# Patient Record
Sex: Male | Born: 1965 | Hispanic: No | Marital: Married | Smoking: Never smoker
Health system: Southern US, Community
[De-identification: ages and names within clinical notes are randomized; demographics above are authoritative.]

## PROBLEM LIST (undated history)

## (undated) DIAGNOSIS — N529 Male erectile dysfunction, unspecified: Secondary | ICD-10-CM

## (undated) DIAGNOSIS — G4700 Insomnia, unspecified: Secondary | ICD-10-CM

## (undated) DIAGNOSIS — R1031 Right lower quadrant pain: Secondary | ICD-10-CM

## (undated) DIAGNOSIS — K5730 Diverticulosis of large intestine without perforation or abscess without bleeding: Secondary | ICD-10-CM

## (undated) DIAGNOSIS — R5381 Other malaise: Secondary | ICD-10-CM

## (undated) DIAGNOSIS — F528 Other sexual dysfunction not due to a substance or known physiological condition: Secondary | ICD-10-CM

## (undated) DIAGNOSIS — E739 Lactose intolerance, unspecified: Secondary | ICD-10-CM

## (undated) DIAGNOSIS — F988 Other specified behavioral and emotional disorders with onset usually occurring in childhood and adolescence: Secondary | ICD-10-CM

## (undated) DIAGNOSIS — R21 Rash and other nonspecific skin eruption: Secondary | ICD-10-CM

## (undated) DIAGNOSIS — F329 Major depressive disorder, single episode, unspecified: Secondary | ICD-10-CM

## (undated) DIAGNOSIS — K589 Irritable bowel syndrome without diarrhea: Secondary | ICD-10-CM

## (undated) DIAGNOSIS — R070 Pain in throat: Secondary | ICD-10-CM

## (undated) DIAGNOSIS — E785 Hyperlipidemia, unspecified: Secondary | ICD-10-CM

## (undated) DIAGNOSIS — F411 Generalized anxiety disorder: Secondary | ICD-10-CM

## (undated) DIAGNOSIS — R5383 Other fatigue: Secondary | ICD-10-CM

## (undated) HISTORY — DX: Other malaise: R53.81

## (undated) HISTORY — DX: Diverticulosis of large intestine without perforation or abscess without bleeding: K57.30

## (undated) HISTORY — DX: Male erectile dysfunction, unspecified: N52.9

## (undated) HISTORY — DX: Pain in throat: R07.0

## (undated) HISTORY — DX: Other fatigue: R53.83

## (undated) HISTORY — DX: Lactose intolerance, unspecified: E73.9

## (undated) HISTORY — DX: Insomnia, unspecified: G47.00

## (undated) HISTORY — DX: Generalized anxiety disorder: F41.1

## (undated) HISTORY — DX: Major depressive disorder, single episode, unspecified: F32.9

## (undated) HISTORY — DX: Rash and other nonspecific skin eruption: R21

## (undated) HISTORY — PX: COLONOSCOPY: SHX174

## (undated) HISTORY — DX: Right lower quadrant pain: R10.31

## (undated) HISTORY — DX: Other sexual dysfunction not due to a substance or known physiological condition: F52.8

## (undated) HISTORY — PX: OTHER SURGICAL HISTORY: SHX169

## (undated) HISTORY — DX: Hyperlipidemia, unspecified: E78.5

## (undated) HISTORY — DX: Irritable bowel syndrome without diarrhea: K58.9

## (undated) HISTORY — DX: Other specified behavioral and emotional disorders with onset usually occurring in childhood and adolescence: F98.8

---

## 2006-10-13 ENCOUNTER — Ambulatory Visit: Payer: Self-pay | Admitting: Internal Medicine

## 2006-10-13 LAB — CONVERTED CEMR LAB
ALT: 46 units/L — ABNORMAL HIGH (ref 0–40)
AST: 31 units/L (ref 0–37)
Albumin: 4.3 g/dL (ref 3.5–5.2)
Alkaline Phosphatase: 40 units/L (ref 39–117)
BUN: 9 mg/dL (ref 6–23)
Basophils Absolute: 0.1 10*3/uL (ref 0.0–0.1)
CO2: 30 meq/L (ref 19–32)
Creatinine, Ser: 1 mg/dL (ref 0.4–1.5)
Eosinophils Absolute: 0.1 10*3/uL (ref 0.0–0.6)
GFR calc non Af Amer: 87 mL/min
Glucose, Bld: 98 mg/dL (ref 70–99)
HCT: 43.7 % (ref 39.0–52.0)
Lymphocytes Relative: 32.4 % (ref 12.0–46.0)
MCV: 89.7 fL (ref 78.0–100.0)
Monocytes Absolute: 0.5 10*3/uL (ref 0.2–0.7)
Platelets: 239 10*3/uL (ref 150–400)
Potassium: 4.5 meq/L (ref 3.5–5.1)
Sodium: 141 meq/L (ref 135–145)
Total Bilirubin: 0.5 mg/dL (ref 0.3–1.2)

## 2006-11-09 ENCOUNTER — Ambulatory Visit: Payer: Self-pay | Admitting: Gastroenterology

## 2006-12-24 ENCOUNTER — Ambulatory Visit: Payer: Self-pay | Admitting: Gastroenterology

## 2006-12-24 LAB — HM COLONOSCOPY

## 2007-01-22 ENCOUNTER — Ambulatory Visit: Payer: Self-pay | Admitting: Gastroenterology

## 2007-02-04 ENCOUNTER — Encounter: Admission: RE | Admit: 2007-02-04 | Discharge: 2007-02-04 | Payer: Self-pay | Admitting: Gastroenterology

## 2007-07-08 ENCOUNTER — Ambulatory Visit: Payer: Self-pay | Admitting: Internal Medicine

## 2007-07-08 DIAGNOSIS — F528 Other sexual dysfunction not due to a substance or known physiological condition: Secondary | ICD-10-CM | POA: Insufficient documentation

## 2007-07-08 DIAGNOSIS — E785 Hyperlipidemia, unspecified: Secondary | ICD-10-CM

## 2007-07-08 DIAGNOSIS — E739 Lactose intolerance, unspecified: Secondary | ICD-10-CM

## 2007-07-08 DIAGNOSIS — F988 Other specified behavioral and emotional disorders with onset usually occurring in childhood and adolescence: Secondary | ICD-10-CM | POA: Insufficient documentation

## 2007-07-08 DIAGNOSIS — F411 Generalized anxiety disorder: Secondary | ICD-10-CM

## 2007-07-08 DIAGNOSIS — F3289 Other specified depressive episodes: Secondary | ICD-10-CM

## 2007-07-08 DIAGNOSIS — F32A Depression, unspecified: Secondary | ICD-10-CM | POA: Insufficient documentation

## 2007-07-08 DIAGNOSIS — K5730 Diverticulosis of large intestine without perforation or abscess without bleeding: Secondary | ICD-10-CM | POA: Insufficient documentation

## 2007-07-08 DIAGNOSIS — G4700 Insomnia, unspecified: Secondary | ICD-10-CM | POA: Insufficient documentation

## 2007-07-08 DIAGNOSIS — F329 Major depressive disorder, single episode, unspecified: Secondary | ICD-10-CM

## 2007-07-08 DIAGNOSIS — K589 Irritable bowel syndrome without diarrhea: Secondary | ICD-10-CM

## 2007-07-08 DIAGNOSIS — R7302 Impaired glucose tolerance (oral): Secondary | ICD-10-CM | POA: Insufficient documentation

## 2007-07-08 HISTORY — DX: Major depressive disorder, single episode, unspecified: F32.9

## 2007-07-08 HISTORY — DX: Irritable bowel syndrome, unspecified: K58.9

## 2007-07-08 HISTORY — DX: Other sexual dysfunction not due to a substance or known physiological condition: F52.8

## 2007-07-08 HISTORY — DX: Generalized anxiety disorder: F41.1

## 2007-07-08 HISTORY — DX: Insomnia, unspecified: G47.00

## 2007-07-08 HISTORY — DX: Other specified behavioral and emotional disorders with onset usually occurring in childhood and adolescence: F98.8

## 2007-07-08 HISTORY — DX: Lactose intolerance, unspecified: E73.9

## 2007-07-08 HISTORY — DX: Diverticulosis of large intestine without perforation or abscess without bleeding: K57.30

## 2007-07-08 HISTORY — DX: Hyperlipidemia, unspecified: E78.5

## 2007-07-08 HISTORY — DX: Other specified depressive episodes: F32.89

## 2008-03-09 ENCOUNTER — Ambulatory Visit: Payer: Self-pay | Admitting: Internal Medicine

## 2008-03-09 DIAGNOSIS — R5383 Other fatigue: Secondary | ICD-10-CM

## 2008-03-09 DIAGNOSIS — R5381 Other malaise: Secondary | ICD-10-CM

## 2008-03-09 HISTORY — DX: Other malaise: R53.81

## 2008-03-09 LAB — CONVERTED CEMR LAB
ALT: 42 units/L (ref 0–53)
BUN: 12 mg/dL (ref 6–23)
CO2: 30 meq/L (ref 19–32)
Calcium: 9.8 mg/dL (ref 8.4–10.5)
Chloride: 103 meq/L (ref 96–112)
GFR calc Af Amer: 104 mL/min
Glucose, Bld: 106 mg/dL — ABNORMAL HIGH (ref 70–99)
HCT: 44.3 % (ref 39.0–52.0)
HDL: 42.6 mg/dL (ref >39.0)
Hemoglobin, Urine: NEGATIVE
Hemoglobin: 15.4 g/dL (ref 13.0–17.0)
Lymphocytes Relative: 34 % (ref 12.0–46.0)
MCHC: 34.8 g/dL (ref 30.0–36.0)
Monocytes Absolute: 0.6 10*3/uL (ref 0.1–1.0)
Monocytes Relative: 11.2 % (ref 3.0–12.0)
Neutrophils Relative %: 50.5 % (ref 43.0–77.0)
Nitrite: NEGATIVE
RDW: 11.6 % (ref 11.5–14.6)
Specific Gravity, Urine: <=1.005 (ref 1.000–1.03)
Testosterone: 413.09 ng/dL (ref 350.00–890)
Total CHOL/HDL Ratio: 4.7
Total Protein, Urine: NEGATIVE mg/dL
Triglycerides: 79 mg/dL (ref 0–149)
Urine Glucose: NEGATIVE mg/dL
VLDL: 16 mg/dL (ref 0–40)

## 2008-05-23 ENCOUNTER — Ambulatory Visit: Payer: Self-pay | Admitting: Internal Medicine

## 2008-05-23 DIAGNOSIS — R21 Rash and other nonspecific skin eruption: Secondary | ICD-10-CM

## 2008-05-23 HISTORY — DX: Rash and other nonspecific skin eruption: R21

## 2008-12-11 ENCOUNTER — Telehealth: Payer: Self-pay | Admitting: Internal Medicine

## 2009-07-18 ENCOUNTER — Ambulatory Visit: Payer: Self-pay | Admitting: Internal Medicine

## 2009-07-18 DIAGNOSIS — N529 Male erectile dysfunction, unspecified: Secondary | ICD-10-CM

## 2009-07-18 HISTORY — DX: Male erectile dysfunction, unspecified: N52.9

## 2009-07-18 LAB — CONVERTED CEMR LAB
Alkaline Phosphatase: 39 units/L (ref 39–117)
BUN: 11 mg/dL (ref 6–23)
Basophils Absolute: 0.1 10*3/uL (ref 0.0–0.1)
Bilirubin Urine: NEGATIVE
Bilirubin, Direct: 0.1 mg/dL (ref 0.0–0.3)
CO2: 27 meq/L (ref 19–32)
Creatinine, Ser: 1 mg/dL (ref 0.4–1.5)
Eosinophils Absolute: 0.2 10*3/uL (ref 0.0–0.7)
HCT: 46 % (ref 39.0–52.0)
Hgb A1c MFr Bld: 5.9 % (ref 4.6–6.5)
Leukocytes, UA: NEGATIVE
MCHC: 32.6 g/dL (ref 30.0–36.0)
MCV: 94.6 fL (ref 78.0–100.0)
Neutrophils Relative %: 50.5 % (ref 43.0–77.0)
Nitrite: NEGATIVE
PSA: 1.74 ng/mL (ref 0.10–4.00)
Potassium: 4.2 meq/L (ref 3.5–5.1)
RDW: 11.8 % (ref 11.5–14.6)
Saturation Ratios: 31.3 % (ref 20.0–50.0)
Sodium: 139 meq/L (ref 135–145)
TSH: 1.53 microintl units/mL (ref 0.35–5.50)
Total Protein, Urine: NEGATIVE mg/dL
Triglycerides: 96 mg/dL (ref 0.0–149.0)
Urine Glucose: NEGATIVE mg/dL
Urobilinogen, UA: 0.2 (ref 0.0–1.0)
WBC: 4.6 10*3/uL (ref 4.5–10.5)
pH: 6.5 (ref 5.0–8.0)

## 2009-07-20 LAB — CONVERTED CEMR LAB
Testosterone-% Free: 1.6 % (ref 1.6–2.9)
Tissue Transglutaminase Ab, IgA: 0.2 units (ref <7)

## 2009-07-25 ENCOUNTER — Encounter (INDEPENDENT_AMBULATORY_CARE_PROVIDER_SITE_OTHER): Payer: Self-pay | Admitting: *Deleted

## 2009-08-27 ENCOUNTER — Ambulatory Visit: Payer: Self-pay | Admitting: Gastroenterology

## 2009-08-27 DIAGNOSIS — R1031 Right lower quadrant pain: Secondary | ICD-10-CM | POA: Insufficient documentation

## 2009-08-27 HISTORY — DX: Right lower quadrant pain: R10.31

## 2009-08-31 ENCOUNTER — Telehealth: Payer: Self-pay | Admitting: Gastroenterology

## 2010-01-23 ENCOUNTER — Ambulatory Visit: Payer: Self-pay | Admitting: Internal Medicine

## 2010-01-23 DIAGNOSIS — R070 Pain in throat: Secondary | ICD-10-CM

## 2010-01-23 HISTORY — DX: Pain in throat: R07.0

## 2010-01-23 LAB — CONVERTED CEMR LAB
ALT: 40 units/L (ref 0–53)
Basophils Absolute: 0 10*3/uL (ref 0.0–0.1)
Bilirubin, Direct: 0.1 mg/dL (ref 0.0–0.3)
Chloride: 105 meq/L (ref 96–112)
Creatinine, Ser: 1.3 mg/dL (ref 0.4–1.5)
Eosinophils Absolute: 0.2 10*3/uL (ref 0.0–0.7)
GFR calc non Af Amer: 64.19 mL/min (ref >60)
Glucose, Bld: 106 mg/dL — ABNORMAL HIGH (ref 70–99)
Leukocytes, UA: NEGATIVE
MCV: 92.7 fL (ref 78.0–100.0)
Neutrophils Relative %: 51 % (ref 43.0–77.0)
Nitrite: NEGATIVE
Potassium: 6.1 meq/L (ref 3.5–5.1)
RBC: 5.03 M/uL (ref 4.22–5.81)
Specific Gravity, Urine: 1.015 (ref 1.000–1.030)
Total Bilirubin: 0.8 mg/dL (ref 0.3–1.2)
Total Protein, Urine: NEGATIVE mg/dL
Total Protein: 7.3 g/dL (ref 6.0–8.3)
Urine Glucose: NEGATIVE mg/dL
WBC: 5.4 10*3/uL (ref 4.5–10.5)

## 2010-01-24 ENCOUNTER — Ambulatory Visit: Payer: Self-pay | Admitting: Internal Medicine

## 2010-02-05 ENCOUNTER — Ambulatory Visit: Payer: Self-pay | Admitting: Gastroenterology

## 2010-02-25 ENCOUNTER — Ambulatory Visit: Payer: Self-pay | Admitting: Professional

## 2010-03-04 ENCOUNTER — Ambulatory Visit: Payer: Self-pay | Admitting: Professional

## 2010-05-06 ENCOUNTER — Ambulatory Visit: Admit: 2010-05-06 | Payer: Self-pay | Admitting: Gastroenterology

## 2010-05-29 NOTE — Progress Notes (Signed)
Summary: Wrong pharmacy   Phone Note Call from Patient Call back at Home Phone [phone]   Call For: Dr Arlyce Dice Summary of Call: Prescription sent to wrong pharmacy. Uses [facility] on [address].  Initial call taken by: Leanor Kail [facility],  Aug 31, 2009 12:43 PM  Follow-up for Phone Call        Called pt to inform that Med was sent Follow-up by: Merri Ray CMA Duncan Dull),  Aug 31, 2009 1:24 PM    Prescriptions: HYOMAX-SL 0.125 MG SUBL (HYOSCYAMINE SULFATE) take 2 tabs sublingual q.4 h. p.r.n.  #15 x 3   Entered by:   Merri Ray CMA (AAMA)   Authorized by:   Louis Meckel MD   Signed by:   Merri Ray CMA (AAMA) on 08/31/2009   Method used:   Electronically to        [facility]  [phone]* (retail)       [address]       [place], [place]  [address]       Ph: [phone] or [phone]       Fax: [phone]   RxID:   [id number]

## 2010-05-29 NOTE — Letter (Signed)
Summary: [facility] Consult Scheduled Letter  [facility]  [address] [place], [place] [address]   Phone: [phone]  Fax: [phone]      07/25/2009 MRN: [id number]  [facility] [address] [place], [place]  [address]    Dear Mr. EDMISTER,      We have scheduled an appointment for you.  At the recommendation of Dr.John, we have scheduled you a consult with Dr Arlyce [facility]) on May 2,2011 at 9:45AM.Their phone number is [phone].If this appointment day and time is not convenient for you, please feel free to call the office of the doctor you are being referred to at the number listed above and reschedule the appointment.  Thank you,  [address] [facility] 3rd Floor  [place], [place] [address]  Patient Care Coordinator [facility]

## 2010-05-29 NOTE — Assessment & Plan Note (Signed)
Summary: PER PT D/T-STOMACH ISSUES-FEVER OFF AND ON-SORE THROAT-COUGH-...   Vital Signs:  Patient profile:   45 year old male Height:      67 inches Weight:      163.38 pounds BMI:     25.68 O2 Sat:      97 % on Room air Temp:     98.5 degrees F oral Pulse rate:   55 / minute BP sitting:   108 / 74  (left arm) Cuff size:   regular  Vitals Entered By: Zella Ball Ewing CMA Duncan Dull) (January 23, 2010 10:35 AM)  O2 Flow:  Room air CC: Sore throat, congestion, cough, stomach pain/RE   Primary Care Provider:  Oliver Barre, MD  CC:  Sore throat, congestion, cough, and stomach pain/RE.  History of Present Illness: here after 4 wks ago episode of low grade fever, chills and mild to mod abd pain that lasted 2 wks total, has not been able to excericse;  incidently had URi symtpoms last wk without fever, still with some aching to the throat off adn on mild;  and overall the abd cramping seems overall much worse than usual.  No overt bleeding;  stools different with small stolls and sense of increased constipation. No n/v or radiation of pain. No GU symptoms such a dysuria, or freq  Denies worsening depressive symptoms, suicidal ideation, or panic. Pt denies CP, worsening sob, doe, wheezing, orthopnea, pnd, worsening LE edema, palps, dizziness or syncope   Problems Prior to Update: 1)  Abdominal Pain Right Lower Quadrant  (ICD-789.03) 2)  Preventive Health Care  (ICD-V70.0) 3)  Erectile Dysfunction, Organic  (ICD-607.84) 4)  Rash and Other Nonspecific Skin Eruption  (ICD-782.1) 5)  Fatigue  (ICD-780.79) 6)  Preventive Health Care  (ICD-V70.0) 7)  Hyperlipidemia  (ICD-272.4) 8)  Glucose Intolerance  (ICD-271.3) 9)  Add  (ICD-314.00) 10)  Ibs  (ICD-564.1) 11)  Depression  (ICD-311) 12)  Erectile Dysfunction  (ICD-302.72) 13)  Insomnia-sleep Disorder-unspec  (ICD-780.52) 14)  Anxiety  (ICD-300.00) 15)  Diverticulosis, Colon  (ICD-562.10)  Medications Prior to Update: 1)  Lorazepam 0.5 Mg  Tabs  (Lorazepam) .Marland Kitchen.. 1 By Mouth Once Daily As Needed 2)  Zolpidem Tartrate 10 Mg  Tabs (Zolpidem Tartrate) .Marland Kitchen.. 1 By Mouth At Bedtime As Needed 3)  Viagra 100 Mg  Tabs (Sildenafil Citrate) .Marland Kitchen.. 1 By Mouth Once Daily As Needed 4)  Hyomax-Sl 0.125 Mg Subl (Hyoscyamine Sulfate) .... Take 2 Tabs Sublingual Q.4 H. P.r.n.  Current Medications (verified): 1)  Lorazepam 0.5 Mg  Tabs (Lorazepam) .Marland Kitchen.. 1 By Mouth Once Daily As Needed 2)  Zolpidem Tartrate 10 Mg  Tabs (Zolpidem Tartrate) .Marland Kitchen.. 1 By Mouth At Bedtime As Needed 3)  Viagra 100 Mg  Tabs (Sildenafil Citrate) .Marland Kitchen.. 1 By Mouth Once Daily As Needed 4)  Hyomax-Sl 0.125 Mg Subl (Hyoscyamine Sulfate) .... Take 2 Tabs Sublingual Q.4 H. P.r.n.  Allergies (verified): 1)  Prozac 2)  Wellbutrin 3)  * Imipramine 4)  Celexa 5)  * Pristiq 6)  Effexor  Past History:  Past Surgical History: Last updated: 07/08/2007 Denies surgical history  Social History: Last updated: 08/27/2009 work - [employer] Never Smoked Alcohol use-yes Drug use-no Married Daily Caffeine Use 2 cups per day  Risk Factors: Smoking Status: never (07/08/2007)  Past Medical History: Current Problems:  HYPERLIPIDEMIA (ICD-272.4) GLUCOSE INTOLERANCE (ICD-271.3) ADD (ICD-314.00) IBS (ICD-564.1) DEPRESSION (ICD-311) ERECTILE DYSFUNCTION (ICD-302.72) INSOMNIA-SLEEP DISORDER-UNSPEC (ICD-7 80.52) ANXIETY (ICD-300.00) DIVERTICULOSIS, COLON (ICD-562.10)  Review of Systems  all otherwise negative per pt -    Physical Exam  General:  alert and well-developed.   Head:  normocephalic and atraumatic.   Eyes:  vision grossly intact, pupils equal, and pupils round.   Ears:  R ear normal and no external deformities.   Nose:  no external deformity and no nasal discharge.   Mouth:  no gingival abnormalities and pharynx pink and moist.   Neck:  supple and no masses.   Lungs:  normal respiratory effort and normal breath sounds.   Heart:  normal rate and regular  rhythm.   Abdomen:  soft, non-tender, and normal bowel sounds.  except for mild RLQ tender without guarding or rebound Extremities:  no edema, no erythema  Skin:  color normal and no rashes.   Psych:  not anxious appearing and dysphoric affect.     Impression & Recommendations:  Problem # 1:  ABDOMINAL PAIN RIGHT LOWER QUADRANT (ICD-789.03)  with low grade temp and incr pain recently - ? diverticultiis, now resolved,  ok to check cbc, and f/u with Dr Arlyce Dice;  consider CT for recurrent symtpom  Orders: Gastroenterology Referral (GI) TLB-CBC Platelet - w/Differential (85025-CBCD) TLB-BMP (Basic Metabolic Panel-BMET) (80048-METABOL) TLB-Hepatic/Liver Function Pnl (80076-HEPATIC) TLB-Udip w/ Micro (81001-URINE)  Problem # 2:  THROAT PAIN (ICD-784.1) with recent URI symptoms recntly now resolved except for the discomfort; exam bening, ok to follow for now  Problem # 3:  DEPRESSION (ICD-311)  His updated medication list for this problem includes:    Lorazepam 0.5 Mg Tabs (Lorazepam) .Marland Kitchen... 1 by mouth once daily as needed stable overall by hx and exam, ok to continue meds/tx as is   Complete Medication List: 1)  Lorazepam 0.5 Mg Tabs (Lorazepam) .Marland Kitchen.. 1 by mouth once daily as needed 2)  Zolpidem Tartrate 10 Mg Tabs (Zolpidem tartrate) .Marland Kitchen.. 1 by mouth at bedtime as needed 3)  Viagra 100 Mg Tabs (Sildenafil citrate) .Marland Kitchen.. 1 by mouth once daily as needed 4)  Hyomax-sl 0.125 Mg Subl (Hyoscyamine sulfate) .... Take 2 tabs sublingual q.4 h. p.r.n.  Patient Instructions: 1)  Please go to the Lab in the basement for your blood and/or urine tests today 2)  Please call the number on the [facility] Card for results of your testing  3)  You will be contacted about the referral(s) to: Dr Kaplan/GI 4)  Continue all previous medications as before this visit  5)  Please schedule a follow-up appointment in 6 months with CPX labs

## 2010-05-29 NOTE — Assessment & Plan Note (Signed)
Summary: abd pain--ch.    History of Present Illness Visit Type: Follow-up Visit Primary GI MD: Micheal Heaps MD [facility] Primary Provider: Oliver Barre, MD Requesting Provider: Oliver Barre, MD Chief Complaint: abdominal pain History of Present Illness:   Micheal Grant has returned for reevaluation of abdominall pain.  Since his last visit in 2008 he has continued to have pain in the right periumbilical area which seems to improve with a bowel movement.  With exercise pain worsens.  He underwent colonoscopy in 2008 for this problem which was unrevealing.  Levbid did not help.  Although he moves his bowels up to twice a day he feels there is incomplete incomplete evacuation.  There is no history of melena or hematochezia.  He denies upper GI complaints including nausea, bloating or pyrosis.  His crampy abdominal pain may interfere with his sleep.   GI Review of Systems    Reports abdominal pain.     Location of  Abdominal pain: RUQ.    Denies acid reflux, belching, bloating, chest pain, dysphagia with liquids, dysphagia with solids, heartburn, loss of appetite, nausea, vomiting, vomiting blood, weight loss, and  weight gain.      Reports change in bowel habits, constipation, and  diarrhea.     Denies anal fissure, black tarry stools, diverticulosis, fecal incontinence, heme positive stool, hemorrhoids, irritable bowel syndrome, jaundice, light color stool, liver problems, rectal bleeding, and  rectal pain.    Current Medications (verified): 1)  Lorazepam 0.5 Mg  Tabs (Lorazepam) .Marland Kitchen.. 1 By Mouth Once Daily As Needed 2)  Zolpidem Tartrate 10 Mg  Tabs (Zolpidem Tartrate) .Marland Kitchen.. 1 By Mouth At Bedtime As Needed 3)  Viagra 100 Mg  Tabs (Sildenafil Citrate) .Marland Kitchen.. 1 By Mouth Once Daily As Needed  Allergies (verified): 1)  Prozac 2)  Wellbutrin 3)  * Imipramine 4)  Celexa 5)  * Pristiq 6)  Effexor  Past History:  Past Medical History: Reviewed history from 07/08/2007 and no changes  required. Current Problems:  HYPERLIPIDEMIA (ICD-272.4) GLUCOSE INTOLERANCE (ICD-271.3) ADD (ICD-314.00) IBS (ICD-564.1) DEPRESSION (ICD-311) ERECTILE DYSFUNCTION (ICD-302.72) INSOMNIA-SLEEP DISORDER-UNSPEC (ICD-780.52) ANXIETY (ICD-300.00) DIVERTICULOSIS, COLON (ICD-562.10)  Past Surgical History: Reviewed history from 07/08/2007 and no changes required. Denies surgical history  Family History: Reviewed history from 07/08/2007 and no changes required. sister with depression No FH of Colon Cancer:  Social History: Reviewed history from 07/18/2009 and no changes required. work - [employer] Never Smoked Alcohol use-yes Drug use-no Married Daily Caffeine Use 2 cups per day  Review of Systems       The patient complains of back pain and sleeping problems.  The patient denies allergy/sinus, anemia, anxiety-new, arthritis/joint pain, blood in urine, breast changes/lumps, change in vision, confusion, cough, coughing up blood, depression-new, fainting, fatigue, fever, headaches-new, hearing problems, heart murmur, heart rhythm changes, itching, muscle pains/cramps, night sweats, nosebleeds, shortness of breath, skin rash, sore throat, swelling of feet/legs, swollen lymph glands, thirst - excessive, urination - excessive, urination changes/pain, urine leakage, vision changes, and voice change.         All other systems were reviewed and were negative   Vital Signs:  Patient profile:   45 year old male Height:      67 inches Weight:      171.25 pounds BMI:     26.92 Pulse rate:   68 / minute Pulse rhythm:   regular BP sitting:   118 / 80  (left arm)  Vitals Entered By: Micheal Grant NCMA (Aug 27, 2009 10:07  AM)  Physical Exam  Additional Exam:  On physical exam he is a well-developed well-nourished male  skin: anicteric HEENT: normocephalic; PEERLA; no nasal or pharyngeal abnormalities neck: supple nodes: no cervical lymphadenopathy chest: clear to ausculatation  and percussion heart: no murmurs, gallops, or rubs abd: soft, nontender; BS normoactive; no abdominal masses, tenderness, organomegaly rectal: deferred ext: no cynanosis, clubbing, edema skeletal: no deformities neuro: oriented x 3; no focal abnormalities    Impression & Recommendations:  Problem # 1:  ABDOMINAL PAIN RIGHT LOWER QUADRANT (ICD-789.03) Assessment Unchanged He continues to have pain that is relieved by defecation.  Prior workup including ultrasound was negative.  I suspect this is colonic pain which may be related to mild constipation and bowel spasm.  Recommendations #1 fiber supplementation.  Patient states he is already doing this #2 MiraLax 9-17 g every other day to help keep his bowels moving #3 hyomax p.r.n.  Patient Instructions: 1)  Copy sent to : Micheal Grant 2)  You can pick up your medication from your pharmacy today 3)  Please schedule a 3 week follow up 4)  The medication list was reviewed and reconciled.  All changed / newly prescribed medications were explained.  A complete medication list was provided to the patient / caregiver. Prescriptions: HYOMAX-SL 0.125 MG SUBL (HYOSCYAMINE SULFATE) take 2 tabs sublingual q.4 h. p.r.n.  #15 x 2   Entered and Authorized by:   Micheal Meckel MD   Signed by:   Micheal Meckel MD on 08/27/2009   Method used:   Electronically to        [facility]* (retail)       [address] [place], [place]  [address]       Ph: [phone]       Fax: [phone]   RxID:   [id number]

## 2010-05-29 NOTE — Assessment & Plan Note (Signed)
Summary: f/u appt per pt/#/cd   Vital Signs:  Patient profile:   45 year old male Height:      67 inches Weight:      169.50 pounds BMI:     26.64 O2 Sat:      98 % on Room air Temp:     98.4 degrees F oral Pulse rate:   48 / minute BP sitting:   110 / 72  (left arm) Cuff size:   regular  Vitals Entered ByZella Ball Ewing (July 18, 2009 8:28 AM)  O2 Flow:  Room air  CC: followup/RE   CC:  followup/RE.  History of Present Illness: wt down form 182 to 169 today with better diet and excercise, trying to follow the lower calorie and chol diet;  Pt denies CP, sob, doe, wheezing, orthopnea, pnd, worsening LE edema, palps, dizziness or syncope  Pt denies new neuro symptoms such as headache, facial or extremity weakness  IBS symtpoms seems to be worse  with diet restriction, and more excercise at the diet, and sleep gets worse.  Has some increased ED symptoms with difficulty with softness, and completion.   Denies worsening depressive symptoms, suicidal ideation, panic or increased anxiety per pt  Preventive Screening-Counseling & Management      Drug Use:  no.    Problems Prior to Update: 1)  Erectile Dysfunction, Organic  (ICD-607.84) 2)  Abdominal Pain, Generalized  (ICD-789.07) 3)  Rash and Other Nonspecific Skin Eruption  (ICD-782.1) 4)  Abdominal Pain, Right Upper Quadrant  (ICD-789.01) 5)  Fatigue  (ICD-780.79) 6)  Preventive Health Care  (ICD-V70.0) 7)  Hyperlipidemia  (ICD-272.4) 8)  Glucose Intolerance  (ICD-271.3) 9)  Add  (ICD-314.00) 10)  Ibs  (ICD-564.1) 11)  Depression  (ICD-311) 12)  Erectile Dysfunction  (ICD-302.72) 13)  Insomnia-sleep Disorder-unspec  (ICD-780.52) 14)  Anxiety  (ICD-300.00) 15)  Diverticulosis, Colon  (ICD-562.10)  Medications Prior to Update: 1)  Lorazepam 0.5 Mg  Tabs (Lorazepam) .Marland Kitchen.. 1 By Mouth Once Daily As Needed 2)  Zolpidem Tartrate 10 Mg  Tabs (Zolpidem Tartrate) .Marland Kitchen.. 1 By Mouth At Bedtime As Needed - Needs Return Office Visit For  Further Refills 3)  Viagra 100 Mg  Tabs (Sildenafil Citrate) .Marland Kitchen.. 1 By Mouth Once Daily Prn 4)  Triamcinolone Acetonide 0.5 % Crea (Triamcinolone Acetonide) .... Use Asd Two Times A Day As Needed  Current Medications (verified): 1)  Lorazepam 0.5 Mg  Tabs (Lorazepam) .Marland Kitchen.. 1 By Mouth Once Daily As Needed 2)  Zolpidem Tartrate 10 Mg  Tabs (Zolpidem Tartrate) .Marland Kitchen.. 1 By Mouth At Bedtime As Needed 3)  Viagra 100 Mg  Tabs (Sildenafil Citrate) .Marland Kitchen.. 1 By Mouth Once Daily As Needed  Allergies (verified): 1)  Prozac 2)  Wellbutrin 3)  * Imipramine 4)  Celexa 5)  * Pristiq 6)  Effexor  Past History:  Past Medical History: Last updated: 07/08/2007 Current Problems:  HYPERLIPIDEMIA (ICD-272.4) GLUCOSE INTOLERANCE (ICD-271.3) ADD (ICD-314.00) IBS (ICD-564.1) DEPRESSION (ICD-311) ERECTILE DYSFUNCTION (ICD-302.72) INSOMNIA-SLEEP DISORDER-UNSPEC (ICD-780.52) ANXIETY (ICD-300.00) DIVERTICULOSIS, COLON (ICD-562.10)  Past Surgical History: Last updated: 07/08/2007 Denies surgical history  Family History: Last updated: 07/08/2007 sister with depression  Social History: Last updated: 07/18/2009 work - [employer] Never Smoked Alcohol use-yes Drug use-no Married  Risk Factors: Smoking Status: never (07/08/2007)  Social History: Reviewed history from 07/08/2007 and no changes required. 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         all otherwise negative per pt -     Physical Exam  General:  alert and well-developed.   Head:  normocephalic and atraumatic.   Eyes:  vision grossly intact, pupils equal, and pupils round.   Ears:  R ear normal and no external deformities.   Nose:  no external deformity and no nasal discharge.   Mouth:  no gingival abnormalities and pharynx pink and moist.   Neck:  supple and no masses.   Lungs:  normal respiratory effort and normal breath sounds.   Heart:  normal rate and regular rhythm.   Abdomen:  soft, non-tender, and normal bowel sounds.   Msk:  no joint tenderness and no joint swelling.   Extremities:  no edema, no erythema  Neurologic:  cranial nerves II-XII intact and strength normal in all extremities.   Skin:  color normal and no rashes.   Psych:  good eye contact and not anxious appearing.     Impression & Recommendations:  Problem # 1:  Preventive Health Care (ICD-V70.0) Overall doing well, age appropriate education and counseling updated and referral for appropriate preventive services done unless declined, immunizations up to date or declined, diet counseling done if overweight, urged to quit smoking if smokes , most recent labs reviewed and current ordered if appropriate, ecg reviewed or declined (interpretation per ECG scanned in the EMR if done); information regarding Medicare Prevention requirements given if appropriate  Orders: TLB-BMP (Basic Metabolic Panel-BMET) (80048-METABOL) TLB-CBC Platelet - w/Differential (85025-CBCD) TLB-Hepatic/Liver Function Pnl (80076-HEPATIC) TLB-Lipid Panel (80061-LIPID) TLB-TSH (Thyroid Stimulating Hormone) (84443-TSH) TLB-PSA (Prostate Specific Antigen) (84153-PSA) TLB-Udip ONLY (81003-UDIP)  Problem # 2:  ABDOMINAL PAIN, GENERALIZED (ICD-789.07)  refer gi - wt loss, pain , diarrhea ; ? need EGD;  check celiac labs  Orders: T-Sprue Panel (Celiac Disease Aby Eval) (83516x3/[address]) Gastroenterology Referral (GI)  Problem # 3:  HYPERLIPIDEMIA  (ICD-272.4)  consider statin, Pt to continue diet efforts, - goal LDL less than 100  Labs Reviewed: SGOT: 32 (03/09/2008)   SGPT: 42 (03/09/2008)   HDL:42.6 (03/09/2008)  LDL:141 (03/09/2008)  Chol:199 (03/09/2008)  Trig:79 (03/09/2008)  Problem # 4:  ERECTILE DYSFUNCTION, ORGANIC (ICD-607.84)  His updated medication list for this problem includes:    Viagra 100 Mg Tabs (Sildenafil citrate) .Marland Kitchen... 1 by mouth once daily as needed check testosterone, ? psych stressors related  Orders: T-Testosterone, Free and Total [phone])  Complete Medication List: 1)  Lorazepam 0.5 Mg Tabs (Lorazepam) .Marland Kitchen.. 1 by mouth once daily as needed 2)  Zolpidem Tartrate 10 Mg Tabs (Zolpidem tartrate) .Marland Kitchen.. 1 by mouth at bedtime as needed 3)  Viagra 100 Mg Tabs (Sildenafil citrate) .Marland Kitchen.. 1 by mouth once daily as needed  Other Orders: TLB-IBC Pnl (Iron/FE;Transferrin) (83550-IBC) TLB-A1C / Hgb A1C (Glycohemoglobin) (83036-A1C) TLB-B12 + Folate Pnl (82746_82607-B12/FOL)  Patient Instructions: 1)  Continue all previous medications as before this visit 2)  Please go to the Lab in the basement for your blood and/or urine tests today 3)  You will be contacted about the referral(s) to: GI 4)  Please schedule a follow-up appointment in 1 year. or sooner if needed Prescriptions: VIAGRA 100 MG  TABS (SILDENAFIL CITRATE) 1 by mouth once daily as needed  #  5 x 11   Entered and Authorized by:   Corwin Levins MD   Signed by:   Corwin Levins MD on 07/18/2009   Method used:   Print then Give to Patient   RxID:   [id number] ZOLPIDEM TARTRATE 10 MG  TABS (ZOLPIDEM TARTRATE) 1 by mouth at bedtime as needed  #30 x 5   Entered and Authorized by:   Corwin Levins MD   Signed by:   Corwin Levins MD on 07/18/2009   Method used:   Print then Give to Patient   RxID:   [id number] LORAZEPAM 0.5 MG  TABS (LORAZEPAM) 1 by mouth once daily as needed  #30 x 5   Entered and Authorized by:   Corwin Levins MD   Signed by:    Corwin Levins MD on 07/18/2009   Method used:   Print then Give to Patient   RxID:   [id number]

## 2010-05-29 NOTE — Assessment & Plan Note (Signed)
Summary: ABD PAIN RLQ/YF    History of Present Illness Visit Type: Follow-up Consult Primary GI MD: Melvia Heaps MD [facility] Primary Provider: Oliver Barre, MD Requesting Provider: Oliver Barre, MD Chief Complaint: Patient complains of RLQ camping and pain. Patient recently went to Dr. Jonny Ruiz due to the fever that came with the abdomina  pain. he does notice more constipation.  History of Present Illness:   Micheal Grant returned for evaluation of abdominal pain and constipation.  He has frequent crampy abdominal pain.  He has regular bowel movements though they are low-volume with a sense of incomplete evacuation.  He felt he had a low-grade fever but when asked, temperatures were never above 98.4.  He denies rectal bleeding or melena.  Colonoscopy in 2008 for similar complaints was unremarkable.  He claims to be depressed.  Symptoms were better when he was exercising.  He has ceased this activity as well.  Multiple allergies were noted on his record including Prozac Wellbutrin imipramine Celexa Christy and Effexor.  When asked about this he states that he was intolerant to many of these medications because of side effects.   GI Review of Systems    Reports abdominal pain.     Location of  Abdominal pain: RLQ.    Denies acid reflux, belching, bloating, chest pain, dysphagia with liquids, dysphagia with solids, heartburn, loss of appetite, nausea, vomiting, vomiting blood, weight loss, and  weight gain.      Reports change in bowel habits and  constipation.     Denies anal fissure, black tarry stools, diarrhea, diverticulosis, fecal incontinence, heme positive stool, hemorrhoids, irritable bowel syndrome, jaundice, light color stool, liver problems, rectal bleeding, and  rectal pain.    Current Medications (verified): 1)  Lorazepam 0.5 Mg  Tabs (Lorazepam) .Marland Kitchen.. 1 By Mouth Once Daily As Needed 2)  Zolpidem Tartrate 10 Mg  Tabs (Zolpidem Tartrate) .Marland Kitchen.. 1 By Mouth At Bedtime As Needed 3)  Viagra 100  Mg  Tabs (Sildenafil Citrate) .Marland Kitchen.. 1 By Mouth Once Daily As Needed 4)  Hyomax-Sl 0.125 Mg Subl (Hyoscyamine Sulfate) .... Take 2 Tabs Sublingual Q.4 H. P.r.n. 5)  Melatonin 3 Mg Tabs (Melatonin) .... Take One By Mouth At Bedtime  Allergies (verified): 1)  Prozac 2)  Wellbutrin 3)  * Imipramine 4)  Celexa 5)  * Pristiq 6)  Effexor  Past History:  Past Medical History: Reviewed history from 01/23/2010 and no changes required. Current Problems:  HYPERLIPIDEMIA (ICD-272.4) GLUCOSE INTOLERANCE (ICD-271.3) ADD (ICD-314.00) IBS (ICD-564.1) DEPRESSION (ICD-311) ERECTILE DYSFUNCTION (ICD-302.72) INSOMNIA-SLEEP DISORDER-UNSPEC (ICD-7 80.52) ANXIETY (ICD-300.00) DIVERTICULOSIS, COLON (ICD-562.10)  Past Surgical History: Reviewed history from 07/08/2007 and no changes required. Denies surgical history  Family History: sister with depression No FH of Colon Cancer: Family History of Prostate Cancer:father Family History of Diabetes: mother Family History of Heart Disease: father. had bypass  Social History: Reviewed history from 08/27/2009 and no changes required. work - Best [employer] Never Smoked Alcohol use-yes rare Drug use-no Married Daily Caffeine Use 2 cups per day  Review of Systems  The patient denies allergy/sinus, anemia, anxiety-new, arthritis/joint pain, back pain, blood in urine, breast changes/lumps, change in vision, confusion, cough, coughing up blood, depression-new, fainting, fatigue, fever, headaches-new, hearing problems, heart murmur, heart rhythm changes, itching, menstrual pain, muscle pains/cramps, night sweats, nosebleeds, pregnancy symptoms, shortness of breath, skin rash, sleeping problems, sore throat, swelling of feet/legs, swollen lymph glands, thirst - excessive , urination - excessive , urination changes/pain, urine leakage, vision changes, and voice change.  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  Structural abnormality of the colon is unlikely.  Recommendations #1 I encouraged the patient to take fiber supplements daily #2 hyomax sublingual q.4 h. p.r.n.  The patient was taking this only occasionally and I suggested that he be more aggressive with the medication.  Problem # 2:  DEPRESSION (ICD-311) We had a long discussion about depression and how it has affected his GI tract.  We discussed consideration for both counseling and medication therapy.  The patient is a amenable to this.  To this end he will be referred to Dr. Caralyn Guile.  I believe he needs a psychiatric referral  for consideration of medication for depression.  I will leave that to Dr. Dellia Cloud.  Patient Instructions: 1)  Copy sent to : Oliver Barre, MD, Dr. Caralyn Guile 2)  We have given you the direct number to schedule your appointment with Dr Sherilyn Banker will help with scheduling that appointment. 3)  You will return here to see Dr Arlyce Dice in 6 weeks 4)  The medication list was reviewed and reconciled.  All changed /  newly prescribed medications were explained.  A complete medication list was provided to the patient / caregiver.

## 2010-07-18 ENCOUNTER — Other Ambulatory Visit: Payer: Self-pay

## 2010-07-25 ENCOUNTER — Other Ambulatory Visit (INDEPENDENT_AMBULATORY_CARE_PROVIDER_SITE_OTHER): Payer: 59 | Admitting: Internal Medicine

## 2010-07-25 ENCOUNTER — Other Ambulatory Visit (INDEPENDENT_AMBULATORY_CARE_PROVIDER_SITE_OTHER): Payer: 59

## 2010-07-25 ENCOUNTER — Ambulatory Visit (INDEPENDENT_AMBULATORY_CARE_PROVIDER_SITE_OTHER): Payer: 59 | Admitting: Internal Medicine

## 2010-07-25 ENCOUNTER — Encounter: Payer: Self-pay | Admitting: Internal Medicine

## 2010-07-25 VITALS — BP 112/80 | HR 48 | Temp 98.4°F | Ht 67.0 in | Wt 165.0 lb

## 2010-07-25 DIAGNOSIS — E739 Lactose intolerance, unspecified: Secondary | ICD-10-CM

## 2010-07-25 DIAGNOSIS — Z0000 Encounter for general adult medical examination without abnormal findings: Secondary | ICD-10-CM

## 2010-07-25 DIAGNOSIS — Z0001 Encounter for general adult medical examination with abnormal findings: Secondary | ICD-10-CM | POA: Insufficient documentation

## 2010-07-25 LAB — LDL CHOLESTEROL, DIRECT: Direct LDL: 130.5 mg/dL

## 2010-07-25 LAB — LIPID PANEL
Cholesterol: 201 mg/dL — ABNORMAL HIGH (ref 0–200)
HDL: 54.2 mg/dL (ref >39.00)
Total CHOL/HDL Ratio: 4
VLDL: 13.2 mg/dL (ref 0.0–40.0)

## 2010-07-25 LAB — BASIC METABOLIC PANEL
Calcium: 9.7 mg/dL (ref 8.4–10.5)
Creatinine, Ser: 1.1 mg/dL (ref 0.4–1.5)
Potassium: 5 mEq/L (ref 3.5–5.1)
Sodium: 142 mEq/L (ref 135–145)

## 2010-07-25 LAB — URINALYSIS, ROUTINE W REFLEX MICROSCOPIC
Bilirubin Urine: NEGATIVE
Hgb urine dipstick: NEGATIVE
Urine Glucose: NEGATIVE
Urobilinogen, UA: 0.2 (ref 0.0–1.0)
pH: 6 (ref 5.0–8.0)

## 2010-07-25 LAB — CBC WITH DIFFERENTIAL/PLATELET
Basophils Relative: 0.6 % (ref 0.0–3.0)
Lymphocytes Relative: 35.3 % (ref 12.0–46.0)
Lymphs Abs: 1.8 10*3/uL (ref 0.7–4.0)
MCHC: 34.6 g/dL (ref 30.0–36.0)
Monocytes Relative: 11.8 % (ref 3.0–12.0)
Neutro Abs: 2.5 10*3/uL (ref 1.4–7.7)

## 2010-07-25 LAB — HEPATIC FUNCTION PANEL
AST: 32 U/L (ref 0–37)
Bilirubin, Direct: 0.1 mg/dL (ref 0.0–0.3)
Total Protein: 6.9 g/dL (ref 6.0–8.3)

## 2010-07-25 LAB — PSA: PSA: 1.81 ng/mL (ref 0.10–4.00)

## 2010-07-25 NOTE — Assessment & Plan Note (Signed)
Mild in the past, has lost wt,  stable overall by hx and exam, most recent lab reviewed with pt, and pt to continue medical treatment as before, to check lab today  Lab Results  Component Value Date   HGBA1C 5.9 07/18/2009

## 2010-07-25 NOTE — Progress Notes (Signed)
Subjective:    Patient ID: Micheal Grant, male    DOB: 04-Apr-1966, 45 y.o.   MRN: [id number]  HPI Here for wellness and f/u;  Overall doing ok;  Pt denies CP, worsening SOB, DOE, wheezing, orthopnea, PND, worsening LE edema, palpitations, dizziness or syncope.  Pt denies neurological change such as new Headache, facial or extremity weakness.  Pt denies polydipsia, polyuria, or low sugar symptoms. Pt states overall good compliance with treatment and medications, good tolerability, and trying to follow lower cholesterol diet.  Pt denies worsening depressive symptoms, suicidal ideation or panic. No fever, wt loss except for intentional 13 lbs per pt, night sweats, loss of appetite, or other constitutional symptoms.  Pt states good ability with ADL's, low fall risk, home safety reviewed and adequate, no significant changes in hearing or vision, and occasionally active with exercise.  Did not f/u with GI as he was thinking the palan was to try different anti-spasmodics and he will seek more holistic tx soon. Father takes slo niacin and pt would like to try this (OTC to start).   Past Medical History  Diagnosis Date  . GLUCOSE INTOLERANCE 07/08/2007  . HYPERLIPIDEMIA 07/08/2007  . ANXIETY 07/08/2007  . ERECTILE DYSFUNCTION 07/08/2007  . DEPRESSION 07/08/2007  . ADD 07/08/2007  . DIVERTICULOSIS, COLON 07/08/2007  . IBS 07/08/2007  . 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 INSOMNIA-SLEEP DISORDER-UNSPEC 07/08/2007  . FATIGUE 03/09/2008  . Rash and other nonspecific skin eruption 05/23/2008  . Throat pain 01/23/2010  . ABDOMINAL PAIN RIGHT LOWER QUADRANT 08/27/2009   No past surgical history on file.  reports that he has never smoked. He does not have any smokeless tobacco history on file. He reports that he drinks alcohol. He reports that he uses illicit drugs. family history includes Cancer in his father; Depression in his sister; Diabetes in his mother; and Heart disease in his father. Allergies    Allergen Reactions  . Bupropion Hcl     REACTION: agitation  . Citalopram Hydrobromide     REACTION: agitation  . Desvenlafaxine     REACTION: agitation  . Fluoxetine Hcl     REACTION: agitation  . Venlafaxine     REACTION: agitation    Current Outpatient Prescriptions on File Prior to Visit  Medication Sig Dispense Refill  . LORazepam (ATIVAN) 0.5 MG tablet Take 0.5 mg by mouth daily.        . Melatonin 3 MG TABS Take 3 mg by mouth at bedtime.        . sildenafil (VIAGRA) 100 MG tablet Take 100 mg by mouth daily as needed.        . zolpidem (AMBIEN) 10 MG tablet Take 10 mg by mouth at bedtime as needed.        . hyoscyamine (LEVSIN SL) 0.125 MG SL tablet Take 0.125 mg by mouth. Take 2 tabs sublingual every 4 hours as needed        Review of Systems Review of Systems  Constitutional: Negative for diaphoresis, activity change, appetite change and unexpected weight change.  HENT: Negative for hearing loss, ear pain, facial swelling, mouth sores and neck stiffness.   Eyes: Negative for pain, redness and visual disturbance.  Respiratory: Negative for shortness of breath and wheezing.   Cardiovascular: Negative for chest pain and palpitations.  Gastrointestinal: Negative for diarrhea, blood in stool, abdominal distention and rectal pain.  Genitourinary: Negative for hematuria, flank pain and decreased urine volume.  Musculoskeletal: Negative for myalgias and joint swelling.  Skin:  Negative for color change and wound.  Neurological: Negative for syncope and numbness.  Hematological: Negative for adenopathy.  Psychiatric/Behavioral: Negative for hallucinations, self-injury, decreased concentration and agitation.      Objective:   Physical Exam Physical Exam  VS noted Constitutional: Pt is oriented to person, place, and time. Appears well-developed and well-nourished.  HENT:  Head: Normocephalic and atraumatic.  Right Ear: External ear normal.  Left Ear: External ear normal.   Nose: Nose normal.  Mouth/Throat: Oropharynx is clear and moist.  Eyes: Conjunctivae and EOM are normal. Pupils are equal, round, and reactive to light.  Neck: Normal range of motion. Neck supple. No JVD present. No tracheal deviation present.  Cardiovascular: Normal rate, regular rhythm, normal heart sounds and intact distal pulses.   Pulmonary/Chest: Effort normal and breath sounds normal.  Abdominal: Soft. Bowel sounds are normal. There is no tenderness.  Musculoskeletal: Normal range of motion. Exhibits no edema.  Lymphadenopathy:  Has no cervical adenopathy.  Neurological: Pt is alert and oriented to person, place, and time. Pt has normal reflexes. No cranial nerve deficit.  Skin: Skin is warm and dry. No rash noted.  Psychiatric:  Has  normal mood and affect. Behavior is normal. Does seem somewhat dysphoric       Assessment & Plan:

## 2010-07-25 NOTE — Assessment & Plan Note (Signed)

## 2010-07-25 NOTE — Patient Instructions (Addendum)
Please go to LAB in the Basement for the blood and/or urine tests to be done today Please call the number on the Blue Card (the [employer] [facility]) for results of testing in 2-3 days You may want to start the OTC niacin if the LDL is still over 100 Please call for Nurse visit for the tetanus shot at your convenience Continue all other medications as before Please return in 1 year for your yearly visit, or sooner if needed, with Lab testing done 3-5 days before

## 2010-09-10 NOTE — Assessment & Plan Note (Signed)
[facility]                         GASTROENTEROLOGY OFFICE NOTE   WISSAM, RESOR                   MRN:          [id number]  DATE:01/22/2007                            DOB:          [birth year]    PROBLEM:  Abdominal pain.   HISTORY OF PRESENT ILLNESS:  Mr. Lipson has returned for a  scheduled GI followup.  He underwent colonoscopy which demonstrated a  few left sided diverticula.  On fiber supplementation daily, he is  having fairly well formed stools.  If anything stools are a little bit  on the loose side at this point.  He states pain has improved with  Levbid.  He is still having some problems with insomnia and depression.  He notes pain is mostly in the right upper quadrant to right  periumbilical area.  It is relieved with defecation.   PHYSICAL EXAMINATION:  VITAL SIGNS:  Pulse 58, blood pressure 118/68,  weight 175.   IMPRESSION:  1. Abdominal pain related to irritable bowel syndrome.  Chronic      cholecystitis is less likely.  2. Depression.   RECOMMENDATIONS:  1. Continue current regimen.  2. Abdominal ultrasound to rule out any biliary tract disease.  3. Follow with Dr. Jonny Ruiz regarding anxiety and depression.     Barbette Hair. Arlyce Dice, MD,FACG  Electronically Signed    RDK/MedQ  DD: 01/22/2007  DT: 01/22/2007  Job #: [id number]

## 2010-09-10 NOTE — Assessment & Plan Note (Signed)
[facility]                         GASTROENTEROLOGY OFFICE NOTE   Micheal Grant, Micheal Grant                   MRN:          [id number]  DATE:11/09/2006                            DOB:          November 14, 1965    REASON FOR CONSULTATION:  Abdominal pain.   REASON:  Micheal Grant is a 45 year old [place] male referred through  the courtesy of Dr. Melvyn Novas for evaluation.  For at least the last 2 years  he has been complaining of right periumbilical pain.  The pain is  described as crampy pain.  It often will awaken him.  With pain he gets  agitation and has difficulty sleeping.  He has erratic bowels  characterized by loose stools and constipated stools.  He claims to have  intermittent black stools.  Weight has been stable.  He complains of  abdominal bloating as well.   PAST MEDICAL HISTORY:  Pertinent for depression.   FAMILY HISTORY:  Noncontributory.   MEDICATIONS:  Melatonin and Lorazepam.   He has no allergies.  He does not smoke.  He drinks rarely.  He is  single and works as a Lexicographer.   REVIEW OF SYSTEMS:  Positive for insomnia, back pain and fatigue which  he attributes to his insomnia.   PHYSICAL EXAMINATION:  Pulse 56, blood pressure 108/72, weight 174.  HEENT: EOMI. PERRLA. Sclerae are anicteric.  Conjunctivae are pink.  NECK:  Supple without thyromegaly, adenopathy or carotid bruits.  CHEST:  Clear to auscultation and percussion without adventitious  sounds.  CARDIAC:  Regular rhythm; normal S1 S2.  There are no murmurs, gallops  or rubs.  ABDOMEN:  Bowel sounds are normoactive.  Abdomen is soft, non-tender and  non-distended.  There are no abdominal masses, tenderness, splenic  enlargement or hepatomegaly.  EXTREMITIES:  Full range of motion.  No cyanosis, clubbing or edema.  RECTAL:  Deferred.   IMPRESSION:  1. Persistent abdominal pain with erratic bowels.  This probably is      related to irritable bowel  syndrome, though a structural lesion in      the colon ought to be ruled out.  2. Anxiety and depression.   RECOMMENDATIONS:  1. Colonoscopy.  2. Trial of Levbid 0.375 mg b.i.d.  3. To consider antidepressants (patient has been on some medicines for      short periods in the past).     Barbette Hair. Arlyce Dice, MD,FACG  Electronically Signed    RDK/MedQ  DD: 11/09/2006  DT: 11/09/2006  Job #: [id number]

## 2010-10-18 ENCOUNTER — Other Ambulatory Visit: Payer: Self-pay | Admitting: Internal Medicine

## 2010-10-21 NOTE — Telephone Encounter (Signed)
Faxed hardcopy to pharmacy. 

## 2011-06-19 ENCOUNTER — Other Ambulatory Visit: Payer: Self-pay | Admitting: Internal Medicine

## 2011-06-19 NOTE — Telephone Encounter (Signed)
Done hardcopy to robin  

## 2011-06-20 NOTE — Telephone Encounter (Signed)
Faxed hardcopy to pharmacy. 

## 2012-02-02 ENCOUNTER — Other Ambulatory Visit (INDEPENDENT_AMBULATORY_CARE_PROVIDER_SITE_OTHER): Payer: 59

## 2012-02-02 ENCOUNTER — Ambulatory Visit (INDEPENDENT_AMBULATORY_CARE_PROVIDER_SITE_OTHER): Payer: 59 | Admitting: Internal Medicine

## 2012-02-02 ENCOUNTER — Encounter: Payer: Self-pay | Admitting: Internal Medicine

## 2012-02-02 VITALS — BP 120/80 | HR 43 | Temp 97.7°F | Ht 67.0 in | Wt 155.0 lb

## 2012-02-02 DIAGNOSIS — M79609 Pain in unspecified limb: Secondary | ICD-10-CM

## 2012-02-02 DIAGNOSIS — L290 Pruritus ani: Secondary | ICD-10-CM

## 2012-02-02 DIAGNOSIS — Z0000 Encounter for general adult medical examination without abnormal findings: Secondary | ICD-10-CM

## 2012-02-02 DIAGNOSIS — M79601 Pain in right arm: Secondary | ICD-10-CM | POA: Insufficient documentation

## 2012-02-02 LAB — HEPATIC FUNCTION PANEL
ALT: 26 U/L (ref 0–53)
AST: 26 U/L (ref 0–37)
Albumin: 4 g/dL (ref 3.5–5.2)
Alkaline Phosphatase: 41 U/L (ref 39–117)
Bilirubin, Direct: 0.1 mg/dL (ref 0.0–0.3)
Total Bilirubin: 0.5 mg/dL (ref 0.3–1.2)
Total Protein: 6.5 g/dL (ref 6.0–8.3)

## 2012-02-02 LAB — URINALYSIS, ROUTINE W REFLEX MICROSCOPIC
Bilirubin Urine: NEGATIVE
Hgb urine dipstick: NEGATIVE
Ketones, ur: NEGATIVE
Leukocytes, UA: NEGATIVE
Nitrite: NEGATIVE
Specific Gravity, Urine: 1.02
Total Protein, Urine: NEGATIVE
Urine Glucose: NEGATIVE
Urobilinogen, UA: 0.2
pH: 6 (ref 5.0–8.0)

## 2012-02-02 LAB — LIPID PANEL
Total CHOL/HDL Ratio: 3
Triglycerides: 128 mg/dL (ref 0.0–149.0)

## 2012-02-02 LAB — CBC WITH DIFFERENTIAL/PLATELET
Basophils Absolute: 0 10*3/uL (ref 0.0–0.1)
Eosinophils Absolute: 0.2 10*3/uL (ref 0.0–0.7)
Lymphocytes Relative: 38.9 % (ref 12.0–46.0)
MCHC: 33.2 g/dL (ref 30.0–36.0)
Monocytes Relative: 11.4 % (ref 3.0–12.0)
Neutrophils Relative %: 44.6 % (ref 43.0–77.0)
RDW: 12.3 % (ref 11.5–14.6)

## 2012-02-02 LAB — BASIC METABOLIC PANEL
CO2: 29 mEq/L (ref 19–32)
Chloride: 105 mEq/L (ref 96–112)
Creatinine, Ser: 1.1 mg/dL (ref 0.4–1.5)
Glucose, Bld: 102 mg/dL — ABNORMAL HIGH (ref 70–99)
Sodium: 139 mEq/L (ref 135–145)

## 2012-02-02 LAB — TSH: TSH: 1.55 u[IU]/mL (ref 0.35–5.50)

## 2012-02-02 MED ORDER — HYDROCORTISONE 2.5 % RE CREA: TOPICAL_CREAM | Freq: Two times a day (BID) | RECTAL | Status: DC

## 2012-02-02 NOTE — Progress Notes (Signed)
Subjective:    Patient ID: Micheal Grant, male    DOB: 10/14/1965, 46 y.o.   MRN: [id number]  HPI  Here for wellness and f/u;  Overall doing ok;  Pt denies CP, worsening SOB, DOE, wheezing, orthopnea, PND, worsening LE edema, palpitations, dizziness or syncope.  Pt denies neurological change such as new Headache, facial or extremity weakness.  Pt denies polydipsia, polyuria, or low sugar symptoms. Pt states overall good compliance with treatment and medications, good tolerability, and trying to follow lower cholesterol diet.  Pt denies worsening depressive symptoms, suicidal ideation or panic. No fever, wt loss, night sweats, loss of appetite, or other constitutional symptoms.  Pt states good ability with ADL's, low fall risk, home safety reviewed and adequate, no significant changes in hearing or vision, and occasionally active with exercise.  Here also for 4 mo RUE problem after a fall while camping, has still gone to the gym, but still has persistent pain to the elbow, with radaiton to the RUE and distal weakness with picking up objects like a carton of milk , no numbness, overall mild to mod but persistent.   Has seen a holistic MD for IBS, tx with flagyl for 5 days with GI upset so stopped and has not returned; has had recurrent episodes of anal itching several times per yr, usually only lasts 1-2 days, most recent lasted 5 days but since made appt for this has improved, would like cream for future. Past Medical History  Diagnosis Date  . GLUCOSE INTOLERANCE 07/08/2007  . HYPERLIPIDEMIA 07/08/2007  . ANXIETY 07/08/2007  . ERECTILE DYSFUNCTION 07/08/2007  . DEPRESSION 07/08/2007  . ADD 07/08/2007  . DIVERTICULOSIS, COLON 07/08/2007  . IBS 07/08/2007  . 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 INSOMNIA-SLEEP DISORDER-UNSPEC 07/08/2007  . FATIGUE 03/09/2008  . Rash and other nonspecific skin eruption 05/23/2008  . Throat pain 01/23/2010  . ABDOMINAL PAIN RIGHT LOWER QUADRANT 08/27/2009   No past  surgical history on file.  reports that he has never smoked. He does not have any smokeless tobacco history on file. He reports that he drinks alcohol. He reports that he uses illicit drugs. family history includes Cancer in his father; Depression in his sister; Diabetes in his mother; and Heart disease in his father. Allergies  Allergen Reactions  . Bupropion Hcl     REACTION: agitation  . Citalopram Hydrobromide     REACTION: agitation  . Desvenlafaxine     REACTION: agitation  . Fluoxetine Hcl     REACTION: agitation  . Venlafaxine     REACTION: agitation   Current Outpatient Prescriptions on File Prior to Visit  Medication Sig Dispense Refill  . LORazepam (ATIVAN) 0.5 MG tablet TAKE ONE TABLET BY MOUTH EVERY DAY AS NEEDED  90 tablet  0  . Melatonin 3 MG TABS Take 3 mg by mouth at bedtime.        Marland Kitchen VIAGRA 100 MG tablet TAKE ONE TABLET BY MOUTH EVERY DAY AS NEEDED.  30 each  0  . zolpidem (AMBIEN) 10 MG tablet TAKE ONE TABLET BY MOUTH AT BEDTIME AS NEEDED  90 tablet  0   Review of Systems Review of Systems  Constitutional: Negative for diaphoresis, activity change, appetite change and unexpected weight change.  HENT: Negative for hearing loss, ear pain, facial swelling, mouth sores and neck stiffness.   Eyes: Negative for pain, redness and visual disturbance.  Respiratory: Negative for shortness of breath and wheezing.   Cardiovascular: Negative for chest pain and palpitations.  Gastrointestinal: Negative for diarrhea, blood in stool, abdominal distention and rectal pain.  Genitourinary: Negative for hematuria, flank pain and decreased urine volume.  Musculoskeletal: Negative for myalgias and joint swelling.  Skin: Negative for color change and wound.  Neurological: Negative for syncope and numbness.  Hematological: Negative for adenopathy.  Psychiatric/Behavioral: Negative for hallucinations, self-injury, decreased concentration and agitation.      Objective:   Physical  Exam BP 120/80  Pulse 43  Temp 97.7 F (36.5 C) (Oral)  Ht 5\' 7"  (1.702 m)  Wt 155 lb (70.308 kg)  BMI 24.28 kg/m2  SpO2 97% Physical Exam  VS noted Constitutional: Pt is oriented to person, place, and time. Appears well-developed and well-nourished.  HENT:  Head: Normocephalic and atraumatic.  Right Ear: External ear normal.  Left Ear: External ear normal.  Nose: Nose normal.  Mouth/Throat: Oropharynx is clear and moist.  Eyes: Conjunctivae and EOM are normal. Pupils are equal, round, and reactive to light.  Neck: Normal range of motion. Neck supple. No JVD present. No tracheal deviation present.  Cardiovascular: Normal rate, regular rhythm, normal heart sounds and intact distal pulses.   Pulmonary/Chest: Effort normal and breath sounds normal.  Abdominal: Soft. Bowel sounds are normal. There is no tenderness.  Musculoskeletal: Normal range of motion. Exhibits no edema.  Lymphadenopathy:  Has no cervical adenopathy.  Neurological: Pt is alert and oriented to person, place, and time. Pt has normal reflexes. No cranial nerve deficit. Motor 4+/5 distal UE o/w intact Skin: Skin is warm and dry. No rash noted.  Psychiatric:  Has  normal mood and affect. Behavior is normal.     Assessment & Plan:

## 2012-02-02 NOTE — Patient Instructions (Addendum)
Please remember the flu shot at work.  You will be contacted regarding the referral for: nerve test for the upper extremities If abnormal, you will want to see Neurosurgury Take all new medications as prescribed - the steroid cream Continue all other medications as before Please have the pharmacy call with any refills you may need. Please go to LAB in the Basement for the blood and/or urine tests to be done today You will be contacted by phone if any changes need to be made immediately.  Otherwise, you will receive a letter about your results with an explanation. Please remember to sign up for My Chart at your earliest convenience, as this will be important to you in the future with finding out test results. Please return in 1 year for your yearly visit, or sooner if needed, with Lab testing done 3-5 days before

## 2012-02-02 NOTE — Assessment & Plan Note (Signed)
With high suspicion for ulnar entrapment issue vs CTS - for NCS/EMG, consider NS referral

## 2012-02-07 ENCOUNTER — Encounter: Payer: Self-pay | Admitting: Internal Medicine

## 2012-02-07 NOTE — Assessment & Plan Note (Signed)

## 2012-02-07 NOTE — Assessment & Plan Note (Signed)
For proctosol prn,  to f/u any worsening symptoms or concerns

## 2012-03-15 ENCOUNTER — Encounter: Payer: Self-pay | Admitting: Internal Medicine

## 2012-03-30 ENCOUNTER — Encounter: Payer: Self-pay | Admitting: Internal Medicine

## 2012-04-07 ENCOUNTER — Other Ambulatory Visit: Payer: Self-pay | Admitting: Internal Medicine

## 2012-04-08 ENCOUNTER — Telehealth: Payer: Self-pay | Admitting: Internal Medicine

## 2012-04-08 MED ORDER — LORAZEPAM 0.5 MG PO TABS
0.5000 mg | ORAL_TABLET | Freq: Every day | ORAL | Status: DC | PRN
Start: 2012-04-08 — End: 2013-01-05

## 2012-04-08 MED ORDER — SILDENAFIL CITRATE 100 MG PO TABS: 50.0000 mg | ORAL_TABLET | Freq: Every day | ORAL | Status: DC | PRN

## 2012-04-08 NOTE — Telephone Encounter (Signed)
viagra - done erx  Done hardcopy to robin - lorazepam

## 2012-04-09 NOTE — Telephone Encounter (Signed)
Faxed hardcopy to pharmacy. 

## 2012-04-10 ENCOUNTER — Encounter: Payer: Self-pay | Admitting: Internal Medicine

## 2012-06-12 ENCOUNTER — Other Ambulatory Visit: Payer: Self-pay

## 2012-08-23 ENCOUNTER — Ambulatory Visit (INDEPENDENT_AMBULATORY_CARE_PROVIDER_SITE_OTHER): Payer: 59 | Admitting: Internal Medicine

## 2012-08-23 ENCOUNTER — Ambulatory Visit (INDEPENDENT_AMBULATORY_CARE_PROVIDER_SITE_OTHER)
Admission: RE | Admit: 2012-08-23 | Discharge: 2012-08-23 | Disposition: A | Discharge status: Home / Self Care | Payer: 59 | Source: Ambulatory Visit | Attending: Internal Medicine | Admitting: Internal Medicine

## 2012-08-23 ENCOUNTER — Other Ambulatory Visit (INDEPENDENT_AMBULATORY_CARE_PROVIDER_SITE_OTHER): Payer: 59

## 2012-08-23 ENCOUNTER — Encounter: Payer: Self-pay | Admitting: Internal Medicine

## 2012-08-23 VITALS — BP 114/84 | HR 50 | Temp 98.0°F | Ht 67.0 in | Wt 149.5 lb

## 2012-08-23 DIAGNOSIS — Z0000 Encounter for general adult medical examination without abnormal findings: Secondary | ICD-10-CM

## 2012-08-23 DIAGNOSIS — K589 Irritable bowel syndrome without diarrhea: Secondary | ICD-10-CM

## 2012-08-23 DIAGNOSIS — R972 Elevated prostate specific antigen [PSA]: Secondary | ICD-10-CM

## 2012-08-23 DIAGNOSIS — M25519 Pain in unspecified shoulder: Secondary | ICD-10-CM

## 2012-08-23 DIAGNOSIS — M25512 Pain in left shoulder: Secondary | ICD-10-CM | POA: Insufficient documentation

## 2012-08-23 LAB — URINALYSIS, ROUTINE W REFLEX MICROSCOPIC
Bilirubin Urine: NEGATIVE
Hgb urine dipstick: NEGATIVE
Ketones, ur: NEGATIVE
Urine Glucose: NEGATIVE
Urobilinogen, UA: 0.2 (ref 0.0–1.0)

## 2012-08-23 LAB — CBC WITH DIFFERENTIAL/PLATELET
Basophils Relative: 0.7 % (ref 0.0–3.0)
Eosinophils Relative: 2.7 % (ref 0.0–5.0)
HCT: 43 % (ref 39.0–52.0)
Lymphs Abs: 2.2 10*3/uL (ref 0.7–4.0)
MCV: 92.3 fl (ref 78.0–100.0)
Monocytes Absolute: 0.6 10*3/uL (ref 0.1–1.0)
Monocytes Relative: 11.7 % (ref 3.0–12.0)
RBC: 4.65 Mil/uL (ref 4.22–5.81)
WBC: 5.1 10*3/uL (ref 4.5–10.5)

## 2012-08-23 MED ORDER — LINACLOTIDE 145 MCG PO CAPS: 145.0000 ug | ORAL_CAPSULE | Freq: Every day | ORAL | Status: DC

## 2012-08-23 NOTE — Assessment & Plan Note (Signed)
?   Mild increased/borderline increased velocity - for PSA today

## 2012-08-23 NOTE — Assessment & Plan Note (Signed)
C/w probably impingement syndrome, for nsaid and refer ortho

## 2012-08-23 NOTE — Patient Instructions (Addendum)
Please try the Linzess 145 at one per day (samples and prescription) Please continue all other medications as before, and refills have been done if requested. Please go to the XRAY Department in the Basement (go straight as you get off the elevator) for the x-ray testing - the left shoulder Please go to the LAB in the Basement (turn left off the elevator) for the tests to be done today - just the PSA You will be contacted regarding the referral for: orthopedic Please call for referral to GI if the Linzess does not seem to help Thank you for enrolling in MyChart. Please follow the instructions below to securely access your online medical record. MyChart allows you to send messages to your doctor, view your test results, renew your prescriptions, schedule appointments, and more.

## 2012-08-23 NOTE — Assessment & Plan Note (Signed)
With constipation not improved with recent otc's, for linzess asd, consdier f/u GI if not improved

## 2012-08-23 NOTE — Progress Notes (Signed)
Subjective:    Patient ID: Micheal Grant, male    DOB: 03-31-1966, 47 y.o.   MRN: [id number]  HPI  Here to f/u;  Denies urinary symptoms such as dysuria, frequency, urgency, flank pain, hematuria or n/v, fever, chills.  Has had some left shoulder pain c 3-4 mo with abduction mostly and somewhat with forward elevation, also has some difficulty with reaching behind back as well with decreased ROM.  Also with recent flare of IBS it seems with mid abd knotted feeling with constipation, started with change in diet and routine when at a conference but has not let up for several weeks.   Pt denies fever, wt loss, night sweats, loss of appetite, or other constitutional symptoms Past Medical History  Diagnosis Date  . GLUCOSE INTOLERANCE 07/08/2007  . HYPERLIPIDEMIA 07/08/2007  . ANXIETY 07/08/2007  . ERECTILE DYSFUNCTION 07/08/2007  . DEPRESSION 07/08/2007  . ADD 07/08/2007  . DIVERTICULOSIS, COLON 07/08/2007  . IBS 07/08/2007  . 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 INSOMNIA-SLEEP DISORDER-UNSPEC 07/08/2007  . FATIGUE 03/09/2008  . Rash and other nonspecific skin eruption 05/23/2008  . Throat pain 01/23/2010  . ABDOMINAL PAIN RIGHT LOWER QUADRANT 08/27/2009   No past surgical history on file.  reports that he has never smoked. He does not have any smokeless tobacco history on file. He reports that  drinks alcohol. He reports that he uses illicit drugs. family history includes Cancer in his father; Depression in his sister; Diabetes in his mother; and Heart disease in his father. Allergies  Allergen Reactions  . Bupropion Hcl     REACTION: agitation  . Citalopram Hydrobromide     REACTION: agitation  . Desvenlafaxine     REACTION: agitation  . Fluoxetine Hcl     REACTION: agitation  . Venlafaxine     REACTION: agitation   Current Outpatient Prescriptions on File Prior to Visit  Medication Sig Dispense Refill  . LORazepam (ATIVAN) 0.5 MG tablet Take 1 tablet (0.5 mg total) by mouth daily  as needed for anxiety.  30 tablet  2  . Melatonin 3 MG TABS Take 3 mg by mouth at bedtime.        . sildenafil (VIAGRA) 100 MG tablet Take 0.5-1 tablets (50-100 mg total) by mouth daily as needed for erectile dysfunction.  10 tablet  11  . zolpidem (AMBIEN) 10 MG tablet TAKE ONE TABLET BY MOUTH AT BEDTIME AS NEEDED  90 tablet  0   No current facility-administered medications on file prior to visit.   Review of Systems  Constitutional: Negative for unexpected weight change, or unusual diaphoresis  HENT: Negative for tinnitus.   Eyes: Negative for photophobia and visual disturbance.  Respiratory: Negative for choking and stridor.   Gastrointestinal: Negative for vomiting and blood in stool.  Genitourinary: Negative for hematuria and decreased urine volume.  Musculoskeletal: Negative for acute joint swelling Skin: Negative for color change and wound.  Neurological: Negative for tremors and numbness other than noted  Psychiatric/Behavioral: Negative for decreased concentration or  hyperactivity.       Objective:   Physical Exam BP 114/84  Pulse 50  Temp(Src) 98 F (36.7 C) (Oral)  Ht 5\' 7"  (1.702 m)  Wt 149 lb 8 oz (67.813 kg)  BMI 23.41 kg/m2  SpO2 98% VS noted, not ill appaering, fatigued Constitutional: Pt appears well-developed and well-nourished.  HENT: Head: NCAT.  Right Ear: External ear normal.  Left Ear: External ear normal.  Eyes: Conjunctivae and EOM are normal. Pupils are  equal, round, and reactive to light.  Neck: Normal range of motion. Neck supple.  Cardiovascular: Normal rate and regular rhythm.   Pulmonary/Chest: Effort normal and breath sounds normal.  Abd:  Soft, NT, non-distended, + BS Left shoulder with pain on abduction to 100 degrees only Neurological: Pt is alert. Not confused , motor intact Skin: Skin is warm. No erythema.  Psychiatric: Pt behavior is normal. Thought content normal. Mild nervous     Assessment & Plan:

## 2012-08-24 LAB — HEPATIC FUNCTION PANEL
Albumin: 4.6 g/dL (ref 3.5–5.2)
Total Protein: 6.8 g/dL (ref 6.0–8.3)

## 2012-08-24 LAB — BASIC METABOLIC PANEL
BUN: 11 mg/dL (ref 6–23)
CO2: 31 mEq/L (ref 19–32)
GFR: 81.58 mL/min (ref >60.00)
Glucose, Bld: 89 mg/dL (ref 70–99)
Potassium: 4.6 mEq/L (ref 3.5–5.1)

## 2012-08-24 LAB — LIPID PANEL
HDL: 57.6 mg/dL (ref >39.00)
Triglycerides: 61 mg/dL (ref 0.0–149.0)

## 2013-01-04 ENCOUNTER — Telehealth: Payer: Self-pay | Admitting: Internal Medicine

## 2013-01-04 ENCOUNTER — Encounter: Payer: Self-pay | Admitting: Internal Medicine

## 2013-01-05 MED ORDER — ZOLPIDEM TARTRATE 10 MG PO TABS: ORAL_TABLET | ORAL | Status: DC

## 2013-01-05 MED ORDER — LORAZEPAM 0.5 MG PO TABS: 0.5000 mg | ORAL_TABLET | Freq: Every day | ORAL | Status: DC | PRN

## 2013-01-05 NOTE — Telephone Encounter (Signed)
Faxed hardcopy of both ambien and Ativan to [facility]

## 2013-01-05 NOTE — Telephone Encounter (Signed)
Done hardcopy to robin  

## 2013-02-02 ENCOUNTER — Ambulatory Visit (INDEPENDENT_AMBULATORY_CARE_PROVIDER_SITE_OTHER): Payer: 59 | Admitting: Internal Medicine

## 2013-02-02 ENCOUNTER — Encounter: Payer: Self-pay | Admitting: Internal Medicine

## 2013-02-02 VITALS — BP 120/82 | HR 42 | Temp 97.8°F | Ht 67.0 in | Wt 155.4 lb

## 2013-02-02 DIAGNOSIS — K921 Melena: Secondary | ICD-10-CM

## 2013-02-02 DIAGNOSIS — R7309 Other abnormal glucose: Secondary | ICD-10-CM

## 2013-02-02 DIAGNOSIS — R7302 Impaired glucose tolerance (oral): Secondary | ICD-10-CM

## 2013-02-02 DIAGNOSIS — F528 Other sexual dysfunction not due to a substance or known physiological condition: Secondary | ICD-10-CM

## 2013-02-02 DIAGNOSIS — Z0000 Encounter for general adult medical examination without abnormal findings: Secondary | ICD-10-CM

## 2013-02-02 MED ORDER — VARDENAFIL HCL 20 MG PO TABS: 20.0000 mg | ORAL_TABLET | ORAL | Status: DC | PRN

## 2013-02-02 MED ORDER — CYCLOBENZAPRINE HCL 5 MG PO TABS: 5.0000 mg | ORAL_TABLET | Freq: Three times a day (TID) | ORAL | Status: DC | PRN

## 2013-02-02 NOTE — Progress Notes (Signed)
Subjective:    Patient ID: Micheal Grant, male    DOB: [birth year], 47 y.o.   MRN: [id number]  HPI  Here for wellness and f/u;  Overall doing ok;  Pt denies CP, worsening SOB, DOE, wheezing, orthopnea, PND, worsening LE edema, palpitations, dizziness or syncope.  Pt denies neurological change such as new headache, facial or extremity weakness.  Pt denies polydipsia, polyuria, or low sugar symptoms. Pt states overall good compliance with treatment and medications, good tolerability, and has been trying to follow lower cholesterol diet.  Pt denies worsening depressive symptoms, suicidal ideation or panic. No fever, night sweats, wt loss, loss of appetite, or other constitutional symptoms.  Pt states good ability with ADL's, has low fall risk, home safety reviewed and adequate, no other significant changes in hearing or vision, and only occasionally active with exercise. Does have occas diarrhea with IBS, such as flare recent with travel for work to [employer].  Had some BRB without pain, has hx of diverticulosis on last colonsocpy 2008, had some difficulty with last testing.  OK fo referral per pt.  Declines tetanus and flu shot.  Has gained about 5 lbs, plans to do better with diet.  Also asks for change of viagra as not working as well recently, tried cialis in the past as well Past Medical History  Diagnosis Date  . GLUCOSE INTOLERANCE 07/08/2007  . HYPERLIPIDEMIA 07/08/2007  . ANXIETY 07/08/2007  . ERECTILE DYSFUNCTION 07/08/2007  . DEPRESSION 07/08/2007  . ADD 07/08/2007  . DIVERTICULOSIS, COLON 07/08/2007  . IBS 07/08/2007  . 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 INSOMNIA-SLEEP DISORDER-UNSPEC 07/08/2007  . FATIGUE 03/09/2008  . Rash and other nonspecific skin eruption 05/23/2008  . Throat pain 01/23/2010  . ABDOMINAL PAIN RIGHT LOWER QUADRANT 08/27/2009   No past surgical history on file.  reports that he has never smoked. He does not have any smokeless tobacco history on file. He reports  that he drinks alcohol. He reports that he uses illicit drugs. family history includes Cancer in his father; Depression in his sister; Diabetes in his mother; Heart disease in his father. Allergies  Allergen Reactions  . Bupropion Hcl     REACTION: agitation  . Citalopram Hydrobromide     REACTION: agitation  . Desvenlafaxine     REACTION: agitation  . Fluoxetine Hcl     REACTION: agitation  . Venlafaxine     REACTION: agitation   Current Outpatient Prescriptions on File Prior to Visit  Medication Sig Dispense Refill  . LORazepam (ATIVAN) 0.5 MG tablet Take 1 tablet (0.5 mg total) by mouth daily as needed for anxiety.  30 tablet  2  . Melatonin 3 MG TABS Take 3 mg by mouth at bedtime.        . sildenafil (VIAGRA) 100 MG tablet Take 0.5-1 tablets (50-100 mg total) by mouth daily as needed for erectile dysfunction.  10 tablet  11  . zolpidem (AMBIEN) 10 MG tablet TAKE ONE TABLET BY MOUTH AT BEDTIME AS NEEDED  90 tablet  0   No current facility-administered medications on file prior to visit.   Review of Systems Constitutional: Negative for diaphoresis, activity change, appetite change or unexpected weight change.  HENT: Negative for hearing loss, ear pain, facial swelling, mouth sores and neck stiffness.   Eyes: Negative for pain, redness and visual disturbance.  Respiratory: Negative for shortness of breath and wheezing.   Cardiovascular: Negative for chest pain and palpitations.  Gastrointestinal: Negative for diarrhea, blood in stool, abdominal  distention or other pain Genitourinary: Negative for hematuria, flank pain or change in urine volume.  Musculoskeletal: Negative for myalgias and joint swelling.  Skin: Negative for color change and wound.  Neurological: Negative for syncope and numbness. other than noted Hematological: Negative for adenopathy.  Psychiatric/Behavioral: Negative for hallucinations, self-injury, decreased concentration and agitation.      Objective:    Physical Exam BP 120/82  Pulse 42  Temp(Src) 97.8 F (36.6 C) (Oral)  Ht 5\' 7"  (1.702 m)  Wt 155 lb 6 oz (70.478 kg)  BMI 24.33 kg/m2  SpO2 97% VS noted,  Constitutional: Pt is oriented to person, place, and time. Appears well-developed and well-nourished.  Head: Normocephalic and atraumatic.  Right Ear: External ear normal.  Left Ear: External ear normal.  Nose: Nose normal.  Mouth/Throat: Oropharynx is clear and moist.  Eyes: Conjunctivae and EOM are normal. Pupils are equal, round, and reactive to light.  Neck: Normal range of motion. Neck supple. No JVD present. No tracheal deviation present.  Cardiovascular: Normal rate, regular rhythm, normal heart sounds and intact distal pulses.   Pulmonary/Chest: Effort normal and breath sounds normal.  Abdominal: Soft. Bowel sounds are normal. There is no tenderness. No HSM  Musculoskeletal: Normal range of motion. Exhibits no edema.  Lymphadenopathy:  Has no cervical adenopathy.  Neurological: Pt is alert and oriented to person, place, and time. Pt has normal reflexes. No cranial nerve deficit.  Skin: Skin is warm and dry. No rash noted.  Psychiatric:  Has  normal mood and affect. Behavior is normal. mild nervous    Assessment & Plan:

## 2013-02-02 NOTE — Assessment & Plan Note (Signed)
Ok for referral to GI ? 

## 2013-02-02 NOTE — Addendum Note (Signed)
Addended by: Corwin Levins on: 02/02/2013 01:57 PM   Modules accepted: Orders

## 2013-02-02 NOTE — Assessment & Plan Note (Signed)

## 2013-02-02 NOTE — Patient Instructions (Addendum)
You will be contacted regarding the referral for: GI - Dr Arlyce Dice OK to stop the Viagra .Please take all new medication as prescribed - the levitra as needed Please continue all other medications as before, and refills have been done if requested. Please have the pharmacy call with any other refills you may need. Please continue your efforts at being more active, low cholesterol diet, and weight control. You are otherwise up to date with prevention measures today. Please keep your appointments with your specialists as you may have planned Your lab work from April 2014 was reviewed; I think we can hold off on more lab work today  Please remember to sign up for My Chart if you have not done so, as this will be important to you in the future with finding out test results, communicating by private email, and scheduling acute appointments online when needed.  Please return in 1 year for your yearly visit, or sooner if needed, with Lab testing done 3-5 days before

## 2013-02-02 NOTE — Assessment & Plan Note (Signed)
Ok for [employer] trial,  to f/u any worsening symptoms or concerns

## 2013-02-28 ENCOUNTER — Ambulatory Visit (INDEPENDENT_AMBULATORY_CARE_PROVIDER_SITE_OTHER): Payer: 59 | Admitting: Gastroenterology

## 2013-02-28 ENCOUNTER — Encounter: Payer: Self-pay | Admitting: Gastroenterology

## 2013-02-28 VITALS — BP 106/60 | HR 56 | Ht 67.0 in | Wt 157.8 lb

## 2013-02-28 DIAGNOSIS — K589 Irritable bowel syndrome without diarrhea: Secondary | ICD-10-CM

## 2013-02-28 DIAGNOSIS — K625 Hemorrhage of anus and rectum: Secondary | ICD-10-CM

## 2013-02-28 MED ORDER — HYDROCORTISONE ACETATE 25 MG RE SUPP: 25.0000 mg | Freq: Two times a day (BID) | RECTAL | Status: DC

## 2013-02-28 MED ORDER — HYOSCYAMINE SULFATE ER 0.375 MG PO TBCR: EXTENDED_RELEASE_TABLET | ORAL | Status: DC

## 2013-02-28 NOTE — Assessment & Plan Note (Addendum)
The patient has had several episodes of limited rectal bleeding and complains of rectal pain.  Symptoms are consistent with hemorrhoidal bleeding.  Recommendations #1 Anusol HC suppositories #2 band ligation of hemorrhoids

## 2013-02-28 NOTE — Assessment & Plan Note (Signed)
Plan trial of hyomax as needed for cramping abdominal pain

## 2013-02-28 NOTE — Patient Instructions (Signed)
You have been scheduled for a Hemorrhoidal banding on 03/16/2013 at 2pm Medication is being sent to your pharmacy

## 2013-02-28 NOTE — Progress Notes (Signed)
History of Present Illness:  47 year old [place] male referred for evaluation of rectal bleeding.  On several occasions over the last 2 months he's had limited episodes of bright red blood per rectum consisting of blood in the toilet water and on the tissue.  He complains of rectal soreness both during and following a bowel movement.  He has applied some topical ointments without improvement. Colonoscopy in 2008 demonstrated diverticulosis.  He also has intermittent crampy abdominal pain which may keep him awake at night.    Past Medical History  Diagnosis Date  . GLUCOSE INTOLERANCE 07/08/2007  . HYPERLIPIDEMIA 07/08/2007  . ANXIETY 07/08/2007  . ERECTILE DYSFUNCTION 07/08/2007  . DEPRESSION 07/08/2007  . ADD 07/08/2007  . DIVERTICULOSIS, COLON 07/08/2007  . IBS 07/08/2007  . 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 INSOMNIA-SLEEP DISORDER-UNSPEC 07/08/2007  . FATIGUE 03/09/2008  . Rash and other nonspecific skin eruption 05/23/2008  . Throat pain 01/23/2010  . ABDOMINAL PAIN RIGHT LOWER QUADRANT 08/27/2009   Past Surgical History  Procedure Laterality Date  . None     family history includes Depression in his sister; Diabetes in his mother; Heart disease in his father; Prostate cancer in his father. There is no history of Colon cancer, Colon polyps, Kidney disease, or Liver disease. Current Outpatient Prescriptions  Medication Sig Dispense Refill  . cyclobenzaprine (FLEXERIL) 5 MG tablet Take 1 tablet (5 mg total) by mouth 3 (three) times daily as needed for muscle spasms.  60 tablet  1  . LORazepam (ATIVAN) 0.5 MG tablet Take 1 tablet (0.5 mg total) by mouth daily as needed for anxiety.  30 tablet  2  . Melatonin 3 MG TABS Take 3 mg by mouth at bedtime.        . sildenafil (VIAGRA) 100 MG tablet Take 0.5-1 tablets (50-100 mg total) by mouth daily as needed for erectile dysfunction.  10 tablet  11  . vardenafil (LEVITRA) 20 MG tablet Take 1 tablet (20 mg total) by mouth as needed for erectile  dysfunction.  10 tablet  10  . zolpidem (AMBIEN) 10 MG tablet TAKE ONE TABLET BY MOUTH AT BEDTIME AS NEEDED  90 tablet  0   No current facility-administered medications for this visit.   Allergies as of 02/28/2013 - Review Complete 02/28/2013  Allergen Reaction Noted  . Bupropion hcl  03/09/2008  . Citalopram hydrobromide  03/09/2008  . Desvenlafaxine  03/09/2008  . Fluoxetine hcl  03/09/2008  . Venlafaxine  03/09/2008    reports that he has never smoked. He has never used smokeless tobacco. He reports that he drinks alcohol. He reports that he does not use illicit drugs.     Review of Systems: Pertinent positive and negative review of systems were noted in the above HPI section. All other review of systems were otherwise negative.  Vital signs were reviewed in today's medical record Physical Exam: General: Well developed , well nourished, no acute distress Skin: anicteric Head: Normocephalic and atraumatic Eyes:  sclerae anicteric, EOMI Ears: Normal auditory acuity Mouth: No deformity or lesions Neck: Supple, no masses or thyromegaly Lungs: Clear throughout to auscultation Heart: Regular rate and rhythm; no murmurs, rubs or bruits Abdomen: Soft, non tender and non distended. No masses, hepatosplenomegaly or hernias noted. Normal Bowel sounds Rectal: External skin tag Musculoskeletal: Symmetrical with no gross deformities  Skin: No lesions on visible extremities Pulses:  Normal pulses noted Extremities: No clubbing, cyanosis, edema or deformities noted Neurological: Alert oriented x 4, grossly nonfocal Cervical Nodes:  No  significant cervical adenopathy Inguinal Nodes: No significant inguinal adenopathy Psychological:  Alert and cooperative. Normal mood and affect

## 2013-03-03 ENCOUNTER — Other Ambulatory Visit: Payer: Self-pay

## 2013-03-16 ENCOUNTER — Encounter: Payer: 59 | Admitting: Gastroenterology

## 2013-03-29 ENCOUNTER — Ambulatory Visit (INDEPENDENT_AMBULATORY_CARE_PROVIDER_SITE_OTHER): Payer: 59 | Admitting: Gastroenterology

## 2013-03-29 ENCOUNTER — Encounter: Payer: Self-pay | Admitting: Gastroenterology

## 2013-03-29 VITALS — BP 108/74 | HR 55 | Ht 67.0 in | Wt 158.0 lb

## 2013-03-29 DIAGNOSIS — K648 Other hemorrhoids: Secondary | ICD-10-CM

## 2013-03-29 NOTE — Progress Notes (Signed)
PROCEDURE NOTE: The patient presents with symptomatic grade *2**  hemorrhoids, requesting rubber band ligation of his/her hemorrhoidal disease.  All risks, benefits and alternative forms of therapy were described and informed consent was obtained.   The anorectum was pre-medicated with lubricant and nitroglycerine ointment The decision was made to band the *right anterior** internal hemorrhoid, and the [facility] O'Regan System was used to perform band ligation without complication.  Digital anorectal examination was then performed to assure proper positioning of the band, and to adjust the banded tissue as required.  The patient was discharged home without pain or other issues.  Dietary and behavioral recommendations were given and along with follow-up instructions.    The patient will return in *2** for  follow-up and possible additional banding as required. No complications were encountered and the patient tolerated the procedure well.   

## 2013-03-29 NOTE — Patient Instructions (Addendum)
You will come in for your second banding scheduled on 05/04/2012 at 10:30am    HEMORRHOID BANDING PROCEDURE    FOLLOW-UP CARE   1. The procedure you have had should have been relatively painless since the banding of the area involved does not have nerve endings and there is no pain sensation.  The rubber band cuts off the blood supply to the hemorrhoid and the band may fall off as soon as 48 hours after the banding (the band may occasionally be seen in the toilet bowl following a bowel movement). You may notice a temporary feeling of fullness in the rectum which should respond adequately to plain Tylenol or Motrin.  2. Following the banding, avoid strenuous exercise that evening and resume full activity the next day.  A sitz bath (soaking in a warm tub) or bidet is soothing, and can be useful for cleansing the area after bowel movements.     3. To avoid constipation, take two tablespoons of natural wheat bran, natural oat bran, flax, Benefiber or any over the counter fiber supplement and increase your water intake to 7-8 glasses daily.    4. Unless you have been prescribed anorectal medication, do not put anything inside your rectum for two weeks: No suppositories, enemas, fingers, etc.  5. Occasionally, you may have more bleeding than usual after the banding procedure.  This is often from the untreated hemorrhoids rather than the treated one.  Don't be concerned if there is a tablespoon or so of blood.  If there is more blood than this, lie flat with your bottom higher than your head and apply an ice pack to the area. If the bleeding does not stop within a half an hour or if you feel faint, call our office at [phone] or go to the emergency room.  6. Problems are not common; however, if there is a substantial amount of bleeding, severe pain, chills, fever or difficulty passing urine (very rare) or other problems, you should call us at [phone] or report to the nearest emergency  room.  7. Do not stay seated continuously for more than 2-3 hours for a day or two after the procedure.  Tighten your buttock muscles 10-15 times every two hours and take 10-15 deep breaths every 1-2 hours.  Do not spend more than a few minutes on the toilet if you cannot empty your bowel; instead re-visit the toilet at a later time.

## 2013-04-06 ENCOUNTER — Encounter: Payer: Self-pay | Admitting: Gastroenterology

## 2013-05-04 ENCOUNTER — Encounter: Payer: Self-pay | Admitting: Gastroenterology

## 2013-05-04 ENCOUNTER — Ambulatory Visit (INDEPENDENT_AMBULATORY_CARE_PROVIDER_SITE_OTHER): Payer: 59 | Admitting: Gastroenterology

## 2013-05-04 VITALS — BP 130/78 | HR 60 | Ht 66.0 in | Wt 161.0 lb

## 2013-05-04 DIAGNOSIS — K648 Other hemorrhoids: Secondary | ICD-10-CM

## 2013-05-04 NOTE — Patient Instructions (Signed)
Your 3rd banding is scheduled on 05/30/2013 at [facility]   1. The procedure you have had should have been relatively painless since the banding of the area involved does not have nerve endings and there is no pain sensation.  The rubber band cuts off the blood supply to the hemorrhoid and the band may fall off as soon as 48 hours after the banding (the band may occasionally be seen in the toilet bowl following a bowel movement). You may notice a temporary feeling of fullness in the rectum which should respond adequately to plain Tylenol or Motrin.  2. Following the banding, avoid strenuous exercise that evening and resume full activity the next day.  A sitz bath (soaking in a warm tub) or bidet is soothing, and can be useful for cleansing the area after bowel movements.     3. To avoid constipation, take two tablespoons of natural wheat bran, natural oat bran, flax, Benefiber or any over the counter fiber supplement and increase your water intake to 7-8 glasses daily.    4. Unless you have been prescribed anorectal medication, do not put anything inside your rectum for two weeks: No suppositories, enemas, fingers, etc.  5. Occasionally, you may have more bleeding than usual after the banding procedure.  This is often from the untreated hemorrhoids rather than the treated one.  Don't be concerned if there is a tablespoon or so of blood.  If there is more blood than this, lie flat with your bottom higher than your head and apply an ice pack to the area. If the bleeding does not stop within a half an hour or if you feel faint, call our office at [phone] or go to the emergency room.  6. Problems are not common; however, if there is a substantial amount of bleeding, severe pain, chills, fever or difficulty passing urine (very rare) or other problems, you should call us at [phone] or report to the nearest emergency room.  7. Do not stay seated  continuously for more than 2-3 hours for a day or two after the procedure.  Tighten your buttock muscles 10-15 times every two hours and take 10-15 deep breaths every 1-2 hours.  Do not spend more than a few minutes on the toilet if you cannot empty your bowel; instead re-visit the toilet at a later time.

## 2013-05-04 NOTE — Progress Notes (Signed)
PROCEDURE NOTE: The patient presents with symptomatic grade *2**  hemorrhoids, requesting rubber band ligation of his/her hemorrhoidal disease.  All risks, benefits and alternative forms of therapy were described and informed consent was obtained.   The anorectum was pre-medicated with lubricant and nitroglycerine ointment The decision was made to band the *left lateral** internal hemorrhoid, and the [facility] O'Regan System was used to perform band ligation without complication.  Digital anorectal examination was then performed to assure proper positioning of the band, and to adjust the banded tissue as required.  The patient was discharged home without pain or other issues.  Dietary and behavioral recommendations were given and along with follow-up instructions.    The patient will return in *2** for  follow-up and possible additional banding as required. No complications were encountered and the patient tolerated the procedure well.   

## 2013-05-30 ENCOUNTER — Encounter: Payer: 59 | Admitting: Gastroenterology

## 2013-07-01 ENCOUNTER — Encounter: Payer: 59 | Admitting: Gastroenterology

## 2013-12-06 ENCOUNTER — Other Ambulatory Visit: Payer: Self-pay | Admitting: Internal Medicine

## 2013-12-06 ENCOUNTER — Encounter: Payer: Self-pay | Admitting: Internal Medicine

## 2013-12-07 NOTE — Telephone Encounter (Signed)
Last seen apr 2014  Please make ROV - Left message on MyChart

## 2013-12-14 ENCOUNTER — Ambulatory Visit (INDEPENDENT_AMBULATORY_CARE_PROVIDER_SITE_OTHER): Payer: 59 | Admitting: Internal Medicine

## 2013-12-14 ENCOUNTER — Encounter: Payer: Self-pay | Admitting: Internal Medicine

## 2013-12-14 VITALS — BP 120/70 | HR 39 | Temp 98.0°F | Wt 160.2 lb

## 2013-12-14 DIAGNOSIS — R7302 Impaired glucose tolerance (oral): Secondary | ICD-10-CM

## 2013-12-14 DIAGNOSIS — Z0000 Encounter for general adult medical examination without abnormal findings: Secondary | ICD-10-CM

## 2013-12-14 DIAGNOSIS — R001 Bradycardia, unspecified: Secondary | ICD-10-CM

## 2013-12-14 DIAGNOSIS — I498 Other specified cardiac arrhythmias: Secondary | ICD-10-CM

## 2013-12-14 DIAGNOSIS — R7309 Other abnormal glucose: Secondary | ICD-10-CM

## 2013-12-14 NOTE — Progress Notes (Signed)
Pre visit review using our clinic review tool, if applicable. No additional management support is needed unless otherwise documented below in the visit note. 

## 2013-12-14 NOTE — Progress Notes (Signed)
Subjective:    Patient ID: Micheal Grant, male    DOB: 09/28/1965, 48 y.o.   MRN: [id number]  HPI  Here for wellness and f/u;  Overall doing ok;  Pt denies CP, worsening SOB, DOE, wheezing, orthopnea, PND, worsening LE edema, palpitations, dizziness or syncope.  Pt denies neurological change such as new headache, facial or extremity weakness.  Pt denies polydipsia, polyuria, or low sugar symptoms. Pt states overall good compliance with treatment and medications, good tolerability, and has been trying to follow lower cholesterol diet.  Pt denies worsening depressive symptoms, suicidal ideation or panic. No fever, night sweats, wt loss, loss of appetite, or other constitutional symptoms.  Pt states good ability with ADL's, has low fall risk, home safety reviewed and adequate, no other significant changes in hearing or vision, and only occasionally active with exercise.  Pt did have episode severe LBP 5 days ago, plain films neg by report for deg changes at [facility], tx with predpack and now taking hydrocodone at night only, without bowel or bladder change, fever, wt loss,  worsening LE pain/numbness/weakness, gait change or falls.  Had been regularly excercising until 2 mo ago, checks HR with treadmill - after a warmup, HR 100 goes to 170, but none in past 2 mo, due to increased digestive symtpoms/ibs. Past Medical History  Diagnosis Date  . GLUCOSE INTOLERANCE 07/08/2007  . HYPERLIPIDEMIA 07/08/2007  . ANXIETY 07/08/2007  . ERECTILE DYSFUNCTION 07/08/2007  . DEPRESSION 07/08/2007  . ADD 07/08/2007  . DIVERTICULOSIS, COLON 07/08/2007  . IBS 07/08/2007  . 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 INSOMNIA-SLEEP DISORDER-UNSPEC 07/08/2007  . FATIGUE 03/09/2008  . Rash and other nonspecific skin eruption 05/23/2008  . Throat pain 01/23/2010  . ABDOMINAL PAIN RIGHT LOWER QUADRANT 08/27/2009   Past Surgical History  Procedure Laterality Date  . None      reports that he has never smoked. He has  never used smokeless tobacco. He reports that he drinks alcohol. He reports that he does not use illicit drugs. family history includes Depression in his sister; Diabetes in his mother; Heart disease in his father; Prostate cancer in his father. There is no history of Colon cancer, Colon polyps, Kidney disease, or Liver disease. Allergies  Allergen Reactions  . Bupropion Hcl     REACTION: agitation  . Citalopram Hydrobromide     REACTION: agitation  . Desvenlafaxine     REACTION: agitation  . Fluoxetine Hcl     REACTION: agitation  . Venlafaxine     REACTION: agitation   Current Outpatient Prescriptions on File Prior to Visit  Medication Sig Dispense Refill  . cyclobenzaprine (FLEXERIL) 5 MG tablet Take 1 tablet (5 mg total) by mouth 3 (three) times daily as needed for muscle spasms.  60 tablet  1  . hydrocortisone (ANUSOL-HC) 25 MG suppository Place 1 suppository (25 mg total) rectally every 12 (twelve) hours.  12 suppository  1  . Hyoscyamine Sulfate 0.375 MG TBCR Take one tablet twice a day as needed for abdominal pain  25 tablet  1  . LORazepam (ATIVAN) 0.5 MG tablet Take 1 tablet (0.5 mg total) by mouth daily as needed for anxiety.  30 tablet  2  . Melatonin 3 MG TABS Take 3 mg by mouth at bedtime.        . sildenafil (VIAGRA) 100 MG tablet Take 0.5-1 tablets (50-100 mg total) by mouth daily as needed for erectile dysfunction.  10 tablet  11  . zolpidem (AMBIEN)  10 MG tablet TAKE ONE TABLET BY MOUTH AT BEDTIME AS NEEDED  90 tablet  0   No current facility-administered medications on file prior to visit.   Review of Systems Constitutional: Negative for increased diaphoresis, other activity, appetite or other siginficant weight change  HENT: Negative for worsening hearing loss, ear pain, facial swelling, mouth sores and neck stiffness.   Eyes: Negative for other worsening pain, redness or visual disturbance.  Respiratory: Negative for shortness of breath and wheezing.     Cardiovascular: Negative for chest pain and palpitations.  Gastrointestinal: Negative for diarrhea, blood in stool, abdominal distention or other pain Genitourinary: Negative for hematuria, flank pain or change in urine volume.  Musculoskeletal: Negative for myalgias or other joint complaints.  Skin: Negative for color change and wound.  Neurological: Negative for syncope and numbness. other than noted Hematological: Negative for adenopathy. or other swelling Psychiatric/Behavioral: Negative for hallucinations, self-injury, decreased concentration or other worsening agitation.      Objective:   Physical Exam BP 120/70  Pulse 39  Temp(Src) 98 F (36.7 C) (Oral)  Wt 160 lb 4 oz (72.689 kg)  SpO2 98% VS noted,  Constitutional: Pt is oriented to person, place, and time. Appears well-developed and well-nourished.  Head: Normocephalic and atraumatic.  Right Ear: External ear normal.  Left Ear: External ear normal.  Nose: Nose normal.  Mouth/Throat: Oropharynx is clear and moist.  Eyes: Conjunctivae and EOM are normal. Pupils are equal, round, and reactive to light.  Neck: Normal range of motion. Neck supple. No JVD present. No tracheal deviation present.  Cardiovascular: Slow rate, regular rhythm, normal heart sounds and intact distal pulses.   Pulmonary/Chest: Effort normal and breath sounds without rales or wheezing  Abdominal: Soft. Bowel sounds are normal. NT. No HSM  Musculoskeletal: Normal range of motion. Exhibits no edema.  Lymphadenopathy:  Has no cervical adenopathy.  Neurological: Pt is alert and oriented to person, place, and time. Pt has normal reflexes. No cranial nerve deficit. Motor grossly intact Skin: Skin is warm and dry. No rash noted.  Psychiatric:  Has normal mood and affect. Behavior is normal.      Assessment & Plan:

## 2013-12-14 NOTE — Assessment & Plan Note (Signed)
Marked today , tolerates well but very unusual, has been trending down somewhat last few yrs per pt; for echo, card referral

## 2013-12-14 NOTE — Patient Instructions (Signed)
Please continue all other medications as before, and refills have been done if requested.  Please have the pharmacy call with any other refills you may need.  Please continue your efforts at being more active, low cholesterol diet, and weight control.  You are otherwise up to date with prevention measures today.  Please keep your appointments with your specialists as you may have planned  You will be contacted regarding the referral for: echocardiogram, and cardiology referral  Please go to the LAB in the Basement (turn left off the elevator) for the tests to be done tomorrow  You will be contacted by phone if any changes need to be made immediately.  Otherwise, you will receive a letter about your results with an explanation, but please check with MyChart first.  Please remember to sign up for MyChart if you have not done so, as this will be important to you in the future with finding out test results, communicating by private email, and scheduling acute appointments online when needed.  Please return in 1 year for your yearly visit, or sooner if needed

## 2013-12-15 ENCOUNTER — Other Ambulatory Visit (INDEPENDENT_AMBULATORY_CARE_PROVIDER_SITE_OTHER): Payer: 59

## 2013-12-15 DIAGNOSIS — R7302 Impaired glucose tolerance (oral): Secondary | ICD-10-CM

## 2013-12-15 DIAGNOSIS — Z0000 Encounter for general adult medical examination without abnormal findings: Secondary | ICD-10-CM

## 2013-12-15 DIAGNOSIS — R7309 Other abnormal glucose: Secondary | ICD-10-CM

## 2013-12-15 LAB — TSH: TSH: 2.81 u[IU]/mL (ref 0.35–4.50)

## 2013-12-15 LAB — CBC WITH DIFFERENTIAL/PLATELET
BASOS PCT: 0.9 % (ref 0.0–3.0)
Basophils Absolute: 0 10*3/uL (ref 0.0–0.1)
EOS PCT: 8.1 % — AB (ref 0.0–5.0)
Eosinophils Absolute: 0.4 10*3/uL (ref 0.0–0.7)
HEMATOCRIT: 42.4 % (ref 39.0–52.0)
Hemoglobin: 14.4 g/dL (ref 13.0–17.0)
Lymphocytes Relative: 45 % (ref 12.0–46.0)
Lymphs Abs: 2.2 10*3/uL (ref 0.7–4.0)
MCHC: 34 g/dL (ref 30.0–36.0)
MCV: 92 fl (ref 78.0–100.0)
MONO ABS: 0.5 10*3/uL (ref 0.1–1.0)
MONOS PCT: 11 % (ref 3.0–12.0)
Neutro Abs: 1.7 10*3/uL (ref 1.4–7.7)
Neutrophils Relative %: 35 % — ABNORMAL LOW (ref 43.0–77.0)
Platelets: 186 10*3/uL (ref 150.0–400.0)
RBC: 4.6 Mil/uL (ref 4.22–5.81)
RDW: 12.8 % (ref 11.5–15.5)
WBC: 4.9 10*3/uL (ref 4.0–10.5)

## 2013-12-15 LAB — HEPATIC FUNCTION PANEL
ALBUMIN: 4.3 g/dL (ref 3.5–5.2)
ALT: 36 U/L (ref 0–53)
AST: 31 U/L (ref 0–37)
Alkaline Phosphatase: 33 U/L — ABNORMAL LOW (ref 39–117)
BILIRUBIN TOTAL: 0.7 mg/dL (ref 0.2–1.2)
Bilirubin, Direct: 0.1 mg/dL (ref 0.0–0.3)
Total Protein: 7 g/dL (ref 6.0–8.3)

## 2013-12-15 LAB — BASIC METABOLIC PANEL
BUN: 13 mg/dL (ref 6–23)
CALCIUM: 9.3 mg/dL (ref 8.4–10.5)
CO2: 26 mEq/L (ref 19–32)
CREATININE: 1 mg/dL (ref 0.4–1.5)
Chloride: 103 mEq/L (ref 96–112)
GFR: 83 mL/min (ref >60.00)
Glucose, Bld: 89 mg/dL (ref 70–99)
POTASSIUM: 4 meq/L (ref 3.5–5.1)
Sodium: 135 mEq/L (ref 135–145)

## 2013-12-15 LAB — LIPID PANEL
CHOL/HDL RATIO: 4
Cholesterol: 172 mg/dL (ref 0–200)
HDL: 47 mg/dL (ref >39.00)
LDL Cholesterol: 107 mg/dL — ABNORMAL HIGH (ref 0–99)
NonHDL: 125
Triglycerides: 91 mg/dL (ref 0.0–149.0)
VLDL: 18.2 mg/dL (ref 0.0–40.0)

## 2013-12-15 LAB — URINALYSIS, ROUTINE W REFLEX MICROSCOPIC
BILIRUBIN URINE: NEGATIVE
Hgb urine dipstick: NEGATIVE
Ketones, ur: NEGATIVE
LEUKOCYTES UA: NEGATIVE
Nitrite: NEGATIVE
PH: 7 (ref 5.0–8.0)
RBC / HPF: NONE SEEN (ref 0–?)
TOTAL PROTEIN, URINE-UPE24: NEGATIVE
Urine Glucose: NEGATIVE
Urobilinogen, UA: 0.2 (ref 0.0–1.0)
WBC, UA: NONE SEEN (ref 0–?)

## 2013-12-15 LAB — HEMOGLOBIN A1C: Hgb A1c MFr Bld: 6 % (ref 4.6–6.5)

## 2013-12-15 LAB — PSA: PSA: 1.75 ng/mL (ref 0.10–4.00)

## 2013-12-18 NOTE — Assessment & Plan Note (Signed)
stable overall by history and exam, recent data reviewed with pt, and pt to continue medical treatment as before,  to f/u any worsening symptoms or concerns Lab Results  Component Value Date   HGBA1C 6.0 12/15/2013

## 2013-12-18 NOTE — Assessment & Plan Note (Signed)

## 2013-12-19 ENCOUNTER — Telehealth: Payer: Self-pay | Admitting: Internal Medicine

## 2013-12-19 NOTE — Telephone Encounter (Signed)
Patient is scheduled for echocardiogram this Friday, 12/19/13. He states he is on prednisone and wants to know if this medication can affect the results of this cardiac testing. CB# [phone]

## 2013-12-20 ENCOUNTER — Telehealth: Payer: Self-pay | Admitting: Internal Medicine

## 2013-12-20 MED ORDER — ZOLPIDEM TARTRATE 10 MG PO TABS: ORAL_TABLET | ORAL | Status: DC

## 2013-12-20 NOTE — Telephone Encounter (Signed)
Faxed hardcopy to [facility]

## 2013-12-20 NOTE — Telephone Encounter (Signed)
Called left the patient a detailed message of MD instructions.

## 2013-12-20 NOTE — Telephone Encounter (Signed)
No, would not affect any results, ok to continue all treatment as recommended

## 2013-12-20 NOTE — Telephone Encounter (Signed)
Done hardcopy to robin  

## 2013-12-22 ENCOUNTER — Encounter: Payer: Self-pay | Admitting: Internal Medicine

## 2013-12-23 ENCOUNTER — Other Ambulatory Visit: Payer: Self-pay

## 2013-12-23 ENCOUNTER — Other Ambulatory Visit (HOSPITAL_COMMUNITY): Payer: 59

## 2013-12-23 MED ORDER — LORAZEPAM 0.5 MG PO TABS: 0.5000 mg | ORAL_TABLET | Freq: Every day | ORAL | Status: DC | PRN

## 2013-12-23 NOTE — Telephone Encounter (Signed)
Done hardcopy to robin  

## 2014-01-04 ENCOUNTER — Ambulatory Visit (HOSPITAL_COMMUNITY): Payer: 59 | Attending: Cardiology | Admitting: Radiology

## 2014-01-04 DIAGNOSIS — E785 Hyperlipidemia, unspecified: Secondary | ICD-10-CM | POA: Diagnosis not present

## 2014-01-04 DIAGNOSIS — I379 Nonrheumatic pulmonary valve disorder, unspecified: Secondary | ICD-10-CM | POA: Insufficient documentation

## 2014-01-04 DIAGNOSIS — I495 Sick sinus syndrome: Secondary | ICD-10-CM | POA: Diagnosis present

## 2014-01-04 DIAGNOSIS — I359 Nonrheumatic aortic valve disorder, unspecified: Secondary | ICD-10-CM | POA: Insufficient documentation

## 2014-01-04 DIAGNOSIS — I059 Rheumatic mitral valve disease, unspecified: Secondary | ICD-10-CM | POA: Insufficient documentation

## 2014-01-04 DIAGNOSIS — I498 Other specified cardiac arrhythmias: Secondary | ICD-10-CM

## 2014-01-04 DIAGNOSIS — R001 Bradycardia, unspecified: Secondary | ICD-10-CM

## 2014-01-04 NOTE — Progress Notes (Signed)
Echocardiogram performed.  

## 2014-01-26 ENCOUNTER — Ambulatory Visit (INDEPENDENT_AMBULATORY_CARE_PROVIDER_SITE_OTHER): Payer: 59 | Admitting: Interventional Cardiology

## 2014-01-26 ENCOUNTER — Encounter: Payer: Self-pay | Admitting: Interventional Cardiology

## 2014-01-26 VITALS — BP 118/76 | HR 45 | Ht 67.0 in | Wt 162.2 lb

## 2014-01-26 DIAGNOSIS — R001 Bradycardia, unspecified: Secondary | ICD-10-CM

## 2014-01-26 NOTE — Progress Notes (Signed)
Patient ID: Micheal Grant, male   DOB: 1965/06/20, 48 y.o.   MRN: [id number]     Patient ID: Micheal Grant MRN: [id number] DOB/AGE: [birth year] 48 y.o.   Referring Physician Dr. Jenny Reichmann   Reason for Consultation  bradycardia  HPI: 48 y/o who has had bradycardia for several years.  On a routine visit, his heart rate dropped below 40 on ECG. He was found to have a heart rate of 34. He was sinus bradycardia.  He denies any symptoms of lightheadedness or syncope. He exercises regularly. He goes to the gym and at peak exercise, his heart rate can be in the 160s. He has no problems with exercise. He does this several times a week. His only other medical problem is with irritable bowel syndrome.  He denies any chest discomfort or shortness of breath. His father did have bypass surgery when he was in his late 3s.   Current Outpatient Prescriptions  Medication Sig Dispense Refill  . LORazepam (ATIVAN) 0.5 MG tablet Take 1 tablet (0.5 mg total) by mouth daily as needed for anxiety.  30 tablet  2  . zolpidem (AMBIEN) 10 MG tablet TAKE ONE TABLET BY MOUTH AT BEDTIME AS NEEDED  90 tablet  1  . cyclobenzaprine (FLEXERIL) 5 MG tablet Take 1 tablet (5 mg total) by mouth 3 (three) times daily as needed for muscle spasms.  60 tablet  1  . hydrocortisone (ANUSOL-HC) 25 MG suppository Place 1 suppository (25 mg total) rectally every 12 (twelve) hours.  12 suppository  1  . Hyoscyamine Sulfate 0.375 MG TBCR Take one tablet twice a day as needed for abdominal pain  25 tablet  1  . Melatonin 3 MG TABS Take 3 mg by mouth at bedtime.        . sildenafil (VIAGRA) 100 MG tablet Take 0.5-1 tablets (50-100 mg total) by mouth daily as needed for erectile dysfunction.  10 tablet  11   No current facility-administered medications for this visit.   Past Medical History  Diagnosis Date  . GLUCOSE INTOLERANCE 07/08/2007  . HYPERLIPIDEMIA 07/08/2007  . ANXIETY 07/08/2007  . ERECTILE DYSFUNCTION 07/08/2007  .  DEPRESSION 07/08/2007  . ADD 07/08/2007  . DIVERTICULOSIS, COLON 07/08/2007  . IBS 07/08/2007  . 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 INSOMNIA-SLEEP DISORDER-UNSPEC 07/08/2007  . FATIGUE 03/09/2008  . Rash and other nonspecific skin eruption 05/23/2008  . Throat pain 01/23/2010  . ABDOMINAL PAIN RIGHT LOWER QUADRANT 08/27/2009    Family History  Problem Relation Age of Onset  . Diabetes Mother   . Prostate cancer Father   . Heart disease Father     Bypass  . Depression Sister   . Colon cancer Neg Hx   . Colon polyps Neg Hx   . Kidney disease Neg Hx   . Liver disease Neg Hx     History   Social History  . Marital Status: Single    Spouse Name: N/A    Number of Children: N/A  . Years of Education: N/A   Occupational History  . Designer, jewellery Unemployed   Social History Main Topics  . Smoking status: Never Smoker   . Smokeless tobacco: Never Used  . Alcohol Use: Yes     Comment: 1-2 drinks per week  . Drug Use: No  . Sexual Activity: Not on file   Other Topics Concern  . Not on file   Social History Narrative  . No narrative on file    Past Surgical History  Procedure Laterality Date  . None        (Not in a hospital admission)  Review of systems complete and found to be negative unless listed above .  No nausea, vomiting.  No fever chills, No focal weakness,  No palpitations.  Physical Exam: Filed Vitals:   01/26/14 1031  BP: 118/76  Pulse: 45    Weight: 162 lb 3.2 oz (73.573 kg)  Physical exam:  [place]/AT EOMI No JVD, No carotid bruit RRR S1S2  No wheezing Soft. NT, nondistended No edema. No focal motor or sensory deficits Normal affect  Labs:   Lab Results  Component Value Date   WBC 4.9 12/15/2013   HGB 14.4 12/15/2013   HCT 42.4 12/15/2013   MCV 92.0 12/15/2013   PLT 186.0 12/15/2013   No results found for this basename: NA, K, CL, CO2, BUN, CREATININE, CALCIUM, LABALBU, PROT, BILITOT, ALKPHOS, ALT, AST, GLUCOSE,  in the last 168 hours No  results found for this basename: CKTOTAL, CKMB, CKMBINDEX, TROPONINI    Lab Results  Component Value Date   CHOL 172 12/15/2013   CHOL 181 08/23/2012   CHOL 164 02/02/2012   Lab Results  Component Value Date   HDL 47.00 12/15/2013   HDL 57.60 08/23/2012   HDL 57.30 02/02/2012   Lab Results  Component Value Date   LDLCALC 107* 12/15/2013   LDLCALC 111* 08/23/2012   LDLCALC 81 02/02/2012   Lab Results  Component Value Date   TRIG 91.0 12/15/2013   TRIG 61.0 08/23/2012   TRIG 128.0 02/02/2012   Lab Results  Component Value Date   CHOLHDL 4 12/15/2013   CHOLHDL 3 08/23/2012   CHOLHDL 3 02/02/2012   Lab Results  Component Value Date   LDLDIRECT 130.5 07/25/2010   LDLDIRECT 132.4 07/18/2009       EKG: Sinus bradycardia, IRBBB  ASSESSMENT AND PLAN:  Bradycardia: No symptoms. Initially, I considered having him do an exercise treadmill test but since he is already exercising and had shown that he can get his heart rate into the 160s, there is no need to repeat this test. We went over the symptoms of syncope from bradycardia. If he has any issues like this, he will let us know. Continue regular exercise. Continue aggressive preventive therapy, especially given his family history. He tries to eat healthy.  Reviewed echocardiogram results. LVEF at the lower limit of normal. Only trivial valvular regurgitation.  He may develop conduction disease as he gets older. We talked about pacemakers in general. He does not have an indication for pacemaker at this time. Signed:   Mina Marble, MD, [facility] 01/26/2014, 10:50 AM

## 2014-01-26 NOTE — Patient Instructions (Signed)
Your physician recommends that you schedule a follow-up appointment as needed  

## 2014-08-18 ENCOUNTER — Encounter: Payer: Self-pay | Admitting: Internal Medicine

## 2014-08-18 MED ORDER — LORAZEPAM 0.5 MG PO TABS: 0.5000 mg | ORAL_TABLET | Freq: Every day | ORAL | Status: DC | PRN

## 2014-08-18 MED ORDER — ZOLPIDEM TARTRATE 10 MG PO TABS: ORAL_TABLET | ORAL | Status: DC

## 2014-08-18 NOTE — Telephone Encounter (Signed)
Both Done hardcopy to steph

## 2015-12-28 ENCOUNTER — Other Ambulatory Visit (INDEPENDENT_AMBULATORY_CARE_PROVIDER_SITE_OTHER): Payer: 59

## 2015-12-28 ENCOUNTER — Encounter: Payer: Self-pay | Admitting: Internal Medicine

## 2015-12-28 ENCOUNTER — Ambulatory Visit (INDEPENDENT_AMBULATORY_CARE_PROVIDER_SITE_OTHER): Payer: 59 | Admitting: Internal Medicine

## 2015-12-28 VITALS — BP 128/80 | HR 58 | Ht 67.0 in | Wt 160.0 lb

## 2015-12-28 DIAGNOSIS — Z0001 Encounter for general adult medical examination with abnormal findings: Secondary | ICD-10-CM

## 2015-12-28 DIAGNOSIS — Z1159 Encounter for screening for other viral diseases: Secondary | ICD-10-CM

## 2015-12-28 DIAGNOSIS — F32A Depression, unspecified: Secondary | ICD-10-CM

## 2015-12-28 DIAGNOSIS — R7302 Impaired glucose tolerance (oral): Secondary | ICD-10-CM | POA: Diagnosis not present

## 2015-12-28 DIAGNOSIS — F329 Major depressive disorder, single episode, unspecified: Secondary | ICD-10-CM | POA: Diagnosis not present

## 2015-12-28 DIAGNOSIS — R6889 Other general symptoms and signs: Secondary | ICD-10-CM

## 2015-12-28 DIAGNOSIS — M7072 Other bursitis of hip, left hip: Secondary | ICD-10-CM | POA: Diagnosis not present

## 2015-12-28 LAB — URINALYSIS, ROUTINE W REFLEX MICROSCOPIC
BILIRUBIN URINE: NEGATIVE
Hgb urine dipstick: NEGATIVE
KETONES UR: NEGATIVE
Leukocytes, UA: NEGATIVE
NITRITE: NEGATIVE
PH: 6.5 (ref 5.0–8.0)
SPECIFIC GRAVITY, URINE: 1.02 (ref 1.000–1.030)
Total Protein, Urine: NEGATIVE
Urine Glucose: NEGATIVE
Urobilinogen, UA: 0.2 (ref 0.0–1.0)

## 2015-12-28 LAB — HEPATIC FUNCTION PANEL
ALT: 35 U/L (ref 0–53)
AST: 28 U/L (ref 0–37)
Albumin: 4.7 g/dL (ref 3.5–5.2)
Alkaline Phosphatase: 40 U/L (ref 39–117)
BILIRUBIN DIRECT: 0.1 mg/dL (ref 0.0–0.3)
BILIRUBIN TOTAL: 0.5 mg/dL (ref 0.2–1.2)
Total Protein: 7 g/dL (ref 6.0–8.3)

## 2015-12-28 LAB — TSH: TSH: 2.11 u[IU]/mL (ref 0.35–4.50)

## 2015-12-28 LAB — PSA: PSA: 1.48 ng/mL (ref 0.10–4.00)

## 2015-12-28 LAB — CBC WITH DIFFERENTIAL/PLATELET
BASOS PCT: 0.6 % (ref 0.0–3.0)
Basophils Absolute: 0 10*3/uL (ref 0.0–0.1)
EOS PCT: 3.3 % (ref 0.0–5.0)
Eosinophils Absolute: 0.2 10*3/uL (ref 0.0–0.7)
HEMATOCRIT: 43.6 % (ref 39.0–52.0)
HEMOGLOBIN: 15.1 g/dL (ref 13.0–17.0)
LYMPHS PCT: 33 % (ref 12.0–46.0)
Lymphs Abs: 1.8 10*3/uL (ref 0.7–4.0)
MCHC: 34.7 g/dL (ref 30.0–36.0)
MCV: 89.2 fl (ref 78.0–100.0)
MONOS PCT: 12.7 % — AB (ref 3.0–12.0)
Monocytes Absolute: 0.7 10*3/uL (ref 0.1–1.0)
NEUTROS ABS: 2.7 10*3/uL (ref 1.4–7.7)
Neutrophils Relative %: 50.4 % (ref 43.0–77.0)
PLATELETS: 226 10*3/uL (ref 150.0–400.0)
RBC: 4.89 Mil/uL (ref 4.22–5.81)
RDW: 12.5 % (ref 11.5–15.5)
WBC: 5.4 10*3/uL (ref 4.0–10.5)

## 2015-12-28 LAB — LIPID PANEL
CHOL/HDL RATIO: 4
Cholesterol: 214 mg/dL — ABNORMAL HIGH (ref 0–200)
HDL: 53 mg/dL (ref >39.00)
LDL CALC: 129 mg/dL — AB (ref 0–99)
NONHDL: 160.74
Triglycerides: 157 mg/dL — ABNORMAL HIGH (ref 0.0–149.0)
VLDL: 31.4 mg/dL (ref 0.0–40.0)

## 2015-12-28 LAB — HEPATITIS C ANTIBODY: HCV Ab: NEGATIVE

## 2015-12-28 LAB — BASIC METABOLIC PANEL
BUN: 14 mg/dL (ref 6–23)
CALCIUM: 9.8 mg/dL (ref 8.4–10.5)
CHLORIDE: 102 meq/L (ref 96–112)
CO2: 33 mEq/L — ABNORMAL HIGH (ref 19–32)
CREATININE: 1.11 mg/dL (ref 0.40–1.50)
GFR: 73.84 mL/min (ref >60.00)
Glucose, Bld: 92 mg/dL (ref 70–99)
Potassium: 4.9 mEq/L (ref 3.5–5.1)
Sodium: 140 mEq/L (ref 135–145)

## 2015-12-28 MED ORDER — LORAZEPAM 0.5 MG PO TABS: 0.5000 mg | ORAL_TABLET | Freq: Every day | ORAL | 2 refills | Status: DC | PRN

## 2015-12-28 MED ORDER — ZOLPIDEM TARTRATE 10 MG PO TABS: ORAL_TABLET | ORAL | 1 refills | Status: DC

## 2015-12-28 NOTE — Progress Notes (Signed)
Subjective:    Patient ID: Micheal Grant, male    DOB: 1965/12/18, 50 y.o.   MRN: [id number]  HPI  Here for wellness and f/u;  Overall doing ok;  Pt denies Chest pain, worsening SOB, DOE, wheezing, orthopnea, PND, worsening LE edema, palpitations, dizziness or syncope.  Pt denies neurological change such as new headache, facial or extremity weakness.  Pt denies polydipsia, polyuria, or low sugar symptoms. Pt states overall good compliance with treatment and medications, good tolerability, and has been trying to follow appropriate diet.  Pt denies worsening depressive symptoms, suicidal ideation or panic. No fever, night sweats, wt loss, loss of appetite, or other constitutional symptoms.  Pt states good ability with ADL's, has low fall risk, home safety reviewed and adequate, no other significant changes in hearing or vision, and only occasionally active with exercise.  Less gym work recently but wt overall no change.  Wt Readings from Last 3 Encounters:  12/28/15 160 lb (72.6 kg)  01/26/14 162 lb 3.2 oz (73.6 kg)  12/14/13 160 lb 4 oz (72.7 kg)   C/o left lateral hip pain, started after a strenuous treaedmill session, since then with pain to left hip, sometime lower back pain,, worse also mow the grass and walking up hill and stairs.   Past Medical History:  Diagnosis Date  . ABDOMINAL PAIN RIGHT LOWER QUADRANT 08/27/2009  . ADD 07/08/2007  . ANXIETY 07/08/2007  . DEPRESSION 07/08/2007  . DIVERTICULOSIS, COLON 07/08/2007  . ERECTILE DYSFUNCTION 07/08/2007  . 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 FATIGUE 03/09/2008  . GLUCOSE INTOLERANCE 07/08/2007  . HYPERLIPIDEMIA 07/08/2007  . IBS 07/08/2007  . INSOMNIA-SLEEP DISORDER-UNSPEC 07/08/2007  . Rash and other nonspecific skin eruption 05/23/2008  . Throat pain 01/23/2010   Past Surgical History:  Procedure Laterality Date  . none      reports that he has never smoked. He has never used smokeless tobacco. He reports that he drinks alcohol.  He reports that he does not use drugs. family history includes Depression in his sister; Diabetes in his mother; Heart disease in his father; Prostate cancer in his father. Allergies  Allergen Reactions  . Bupropion Hcl     REACTION: agitation  . Citalopram Hydrobromide     REACTION: agitation  . Desvenlafaxine     REACTION: agitation  . Fluoxetine Hcl     REACTION: agitation  . Venlafaxine     REACTION: agitation   No current outpatient prescriptions on file prior to visit.   No current facility-administered medications on file prior to visit.     Review of Systems Constitutional: Negative for increased diaphoresis, or other activity, appetite or siginficant weight change other than noted HENT: Negative for worsening hearing loss, ear pain, facial swelling, mouth sores and neck stiffness.   Eyes: Negative for other worsening pain, redness or visual disturbance.  Respiratory: Negative for choking or stridor Cardiovascular: Negative for other chest pain and palpitations.  Gastrointestinal: Negative for worsening diarrhea, blood in stool, or abdominal distention Genitourinary: Negative for hematuria, flank pain or change in urine volume.  Musculoskeletal: Negative for myalgias or other joint complaints.  Skin: Negative for other color change and wound or drainage.  Neurological: Negative for syncope and numbness. other than noted Hematological: Negative for adenopathy. or other swelling Psychiatric/Behavioral: Negative for hallucinations, SI, self-injury, decreased concentration or other worsening agitation.      Objective:   Physical Exam BP 128/80   Pulse (!) 58   Ht 5\' 7"  (1.702 m)  Wt 160 lb (72.6 kg)   SpO2 95%   BMI 25.06 kg/m  VS noted,  Constitutional: Pt is oriented to person, place, and time. Appears well-developed and well-nourished, in no significant distress Head: Normocephalic and atraumatic  Eyes: Conjunctivae and EOM are normal. Pupils are equal, round,  and reactive to light Right Ear: External ear normal.  Left Ear: External ear normal Nose: Nose normal.  Mouth/Throat: Oropharynx is clear and moist  Neck: Normal range of motion. Neck supple. No JVD present. No tracheal deviation present or significant neck LA or mass Cardiovascular: Normal rate, regular rhythm, normal heart sounds and intact distal pulses.   Pulmonary/Chest: Effort normal and breath sounds without rales or wheezing  Abdominal: Soft. Bowel sounds are normal. NT. No HSM  Musculoskeletal: Normal range of motion. Exhibits no edema but has mod tender directly over left lateral treater trochanter Lymphadenopathy: Has no cervical adenopathy.  Neurological: Pt is alert and oriented to person, place, and time. Pt has normal reflexes. No cranial nerve deficit. Motor grossly intact Skin: Skin is warm and dry. No rash noted or new ulcers Psychiatric:  Has normal mood and affect. Behavior is normal.     Assessment & Plan:

## 2015-12-28 NOTE — Patient Instructions (Addendum)
Please make a Nurse Visit appt for the tetanus shot and the flu shot at your convenience.    You will be contacted regarding the referral for: Dr Tamala Julian [facility], although you can also make an appt for yourself at the scheduling desk as you leave  Please continue all other medications as before, and refills have been done if requested.  Please have the pharmacy call with any other refills you may need.  Please continue your efforts at being more active, low cholesterol diet, and weight control.  You are otherwise up to date with prevention measures today.  Please keep your appointments with your specialists as you may have planned  Please go to the LAB in the Basement (turn left off the elevator) for the tests to be done today  You will be contacted by phone if any changes need to be made immediately.  Otherwise, you will receive a letter about your results with an explanation, but please check with MyChart first.  Please remember to sign up for MyChart if you have not done so, as this will be important to you in the future with finding out test results, communicating by private email, and scheduling acute appointments online when needed.  Please return in 1 year for your yearly visit, or sooner if needed, with Lab testing done 3-5 days before

## 2015-12-28 NOTE — Progress Notes (Signed)
Pre visit review using our clinic review tool, if applicable. No additional management support is needed unless otherwise documented below in the visit note. 

## 2015-12-29 NOTE — Assessment & Plan Note (Signed)
stable overall by history and exam, recent data reviewed with pt, and pt to continue medical treatment as before,  to f/u any worsening symptoms or concerns  

## 2015-12-29 NOTE — Assessment & Plan Note (Signed)

## 2015-12-29 NOTE — Assessment & Plan Note (Signed)
stable overall by history and exam, recent data reviewed with pt, and pt to continue medical treatment as before,  to f/u any worsening symptoms or concerns Lab Results  Component Value Date   HGBA1C 6.0 12/15/2013

## 2015-12-29 NOTE — Assessment & Plan Note (Addendum)
Mild to mod, declines pain med, likely would be better with cortisone shot, for referral sport med/Dr Tamala Julian  In addition to the time spent performing CPE, I spent an additional 15 minutes face to face,in which greater than 50% of this time was spent in counseling and coordination of care for patient's illness as documented.

## 2016-01-23 NOTE — Progress Notes (Signed)
[facility] [place], [place] [address] Phone: [phone] Subjective:    I'm seeing this patient by the request  of:  Cathlean Cower, MD   CC: Hip pain left  [id number]  Micheal Grant is a 50 y.o. male coming in with complaint of left hip pain. Patient is having pain more on the lateral aspect of the hip. Seems worse at night. Very localized. Mild radiation down the leg. Patient states he can have days where it seems to have completely resolved on days when it is severe enough been given from activity. Going up and down hills and locking on the treadmill seems to be the worse. Uncomfortable at night but does not wake him up at night. Rates the severity of pain overall is 3 out of 10.    Past Medical History:  Diagnosis Date  . ABDOMINAL PAIN RIGHT LOWER QUADRANT 08/27/2009  . ADD 07/08/2007  . ANXIETY 07/08/2007  . DEPRESSION 07/08/2007  . DIVERTICULOSIS, COLON 07/08/2007  . ERECTILE DYSFUNCTION 07/08/2007  . 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 FATIGUE 03/09/2008  . GLUCOSE INTOLERANCE 07/08/2007  . HYPERLIPIDEMIA 07/08/2007  . IBS 07/08/2007  . INSOMNIA-SLEEP DISORDER-UNSPEC 07/08/2007  . Rash and other nonspecific skin eruption 05/23/2008  . Throat pain 01/23/2010   Past Surgical History:  Procedure Laterality Date  . none     Social History   Social History  . Marital status: Single    Spouse name: N/A  . Number of children: N/A  . Years of education: N/A   Occupational History  . Designer, jewellery Unemployed   Social History Main Topics  . Smoking status: Never Smoker  . Smokeless tobacco: Never Used  . Alcohol use Yes     Comment: 1-2 drinks per week  . Drug use: No  . Sexual activity: Not Asked   Other Topics Concern  . None   Social History Narrative  . None   Allergies  Allergen Reactions  . Bupropion Hcl     REACTION: agitation  . Citalopram Hydrobromide     REACTION: agitation  . Desvenlafaxine     REACTION: agitation  . Fluoxetine Hcl     REACTION: agitation  . Venlafaxine     REACTION: agitation   Family History  Problem Relation Age of Onset  . Diabetes Mother   . Prostate cancer Father   . Heart disease Father     Bypass  . Depression Sister   . Colon cancer Neg Hx   . Colon polyps Neg Hx   . Kidney disease Neg Hx   . Liver disease Neg Hx     Past medical history, social, surgical and family history all reviewed in electronic medical record.  No pertanent information unless stated regarding to the chief complaint.   Review of Systems: No headache, visual changes, nausea, vomiting, diarrhea, constipation, dizziness, abdominal pain, skin rash, fevers, chills, night sweats, weight loss, swollen lymph nodes, body aches, joint swelling, muscle aches, chest pain, shortness of breath, mood changes.   Objective  Blood pressure 108/80, pulse (!) 44, weight 162 lb (73.5 kg), SpO2 98 %.  General: No apparent distress alert and oriented x3 mood and affect normal, dressed appropriately.  HEENT: Pupils equal, extraocular movements intact  Respiratory: Patient's speak in full sentences and does not appear short of breath  Cardiovascular: No lower extremity edema, non tender, no erythema  Skin: Warm dry intact with no signs of infection or rash on extremities or  on axial skeleton.  Abdomen: Soft nontender  Neuro: Cranial nerves II through XII are intact, neurovascularly intact in all extremities with 2+ DTRs and 2+ pulses.  Lymph: No lymphadenopathy of posterior or anterior cervical chain or axillae bilaterally.  Gait normal with good balance and coordination.  MSK:  Non tender with full range of motion and good stability and symmetric strength and tone of shoulders, elbows, wrist, knee and ankles bilaterally.  [id number] ROM IR: 25 Deg, ER: 45 Deg, Flexion: 120 Deg, Extension: 100 Deg, Abduction: 45 Deg, Adduction: 45 Deg Strength IR: 5/5, ER: 5/5, Flexion: 5/5, Extension: 5/5,  Abduction: 5/5, Adduction: 5/5 Pelvic alignment unremarkable to inspection and palpation. Standing hip rotation and gait without trendelenburg sign / unsteadiness. Pain over the greater chart and there are Fitzhugh. Positive Faber test. Patient also has pain over the tensor fascia lata with increasing pain with internal rotation on the anterior lateral aspect of the hip but no groin pain. No SI joint tenderness and normal minimal SI movement.  Procedure note [id number]; 15 minutes spent for Therapeutic exercises as stated in above notes.  This included exercises focusing on stretching, strengthening, with significant focus on eccentric aspects. Hip strengthening exercises which included:  Pelvic tilt/bracing to help with proper recruitment of the lower abs and pelvic floor muscles  Glute strengthening to properly contract glutes without over-engaging low back and hamstrings - prone hip extension and glute bridge exercises Proper stretching techniques to increase effectiveness for the hip flexors, groin, quads, piriformic and low back when appropriate     Proper technique shown and discussed handout in great detail with ATC.  All questions were discussed and answered.      Impression and Recommendations:     This case required medical decision making of moderate complexity.      Note: This dictation was prepared with Dragon dictation along with smaller phrase technology. Any transcriptional errors that result from this process are unintentional.

## 2016-01-24 ENCOUNTER — Encounter: Payer: Self-pay | Admitting: Family Medicine

## 2016-01-24 ENCOUNTER — Ambulatory Visit (INDEPENDENT_AMBULATORY_CARE_PROVIDER_SITE_OTHER): Payer: 59 | Admitting: Family Medicine

## 2016-01-24 DIAGNOSIS — M6289 Other specified disorders of muscle: Secondary | ICD-10-CM

## 2016-01-24 DIAGNOSIS — M6580 Other synovitis and tenosynovitis, unspecified site: Secondary | ICD-10-CM

## 2016-01-24 MED ORDER — DICLOFENAC SODIUM 2 % TD SOLN
2.0000 | Freq: Two times a day (BID) | TRANSDERMAL | 3 refills | Status: DC
Start: 2016-01-24 — End: 2020-06-15

## 2016-01-24 NOTE — Patient Instructions (Addendum)
Good to see you.  Ice 20 minutes 2 times daily. Usually after activity and before bed. Exercises 3 times a week.  Elliptical or biking for now for exercise.  pennsaid pinkie amount topically 2 times daily as needed.  Vitamin D 2000 IU daily  See me again in 4 weeks and if still in pain then we will inject and consider physical therapy

## 2016-01-24 NOTE — Assessment & Plan Note (Addendum)
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 Differential includes a lumbar radiculopathy but less likely. We discussed icing regimen, home exercises, over-the-counter medications. Patient was given topical anti-inflammatories. Work with Product/process development scientist to learn home exercises in greater detail. Patient will return again in 3-4 weeks for further evaluation and treatment. If worsening symptoms we'll consider injection.

## 2016-02-20 NOTE — Progress Notes (Signed)
[facility] [place], [place] [address] Phone: [phone] Subjective:     CC: Hip pain left Follow-up  [id number]  Micheal Grant is a 50 y.o. male coming in with complaint of left hip pain. Patient was previously seen and was having more of a lateral pain of the hip. Had more of a greater trochanteric bursitis as well as the tensor fascia lata syndrome. Patient was given home exercises, icing protocol and topical anti-inflammatories. Patient states He is 99% better. Patient states that he is no longer having any pain. Did notice when he did treadmill 3 days in a row started having some mild irritation. States that the topical anti-inflammatories is very helpful. No other new findings.    Past Medical History:  Diagnosis Date  . ABDOMINAL PAIN RIGHT LOWER QUADRANT 08/27/2009  . ADD 07/08/2007  . ANXIETY 07/08/2007  . DEPRESSION 07/08/2007  . DIVERTICULOSIS, COLON 07/08/2007  . ERECTILE DYSFUNCTION 07/08/2007  . 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 FATIGUE 03/09/2008  . GLUCOSE INTOLERANCE 07/08/2007  . HYPERLIPIDEMIA 07/08/2007  . IBS 07/08/2007  . INSOMNIA-SLEEP DISORDER-UNSPEC 07/08/2007  . Rash and other nonspecific skin eruption 05/23/2008  . Throat pain 01/23/2010   Past Surgical History:  Procedure Laterality Date  . none     Social History   Social History  . Marital status: Single    Spouse name: N/A  . Number of children: N/A  . Years of education: N/A   Occupational History  . Designer, jewellery Unemployed   Social History Main Topics  . Smoking status: Never Smoker  . Smokeless tobacco: Never Used  . Alcohol use Yes     Comment: 1-2 drinks per week  . Drug use: No  . Sexual activity: Not Asked   Other Topics Concern  . None   Social History Narrative  . None   Allergies  Allergen Reactions  . Bupropion Hcl     REACTION: agitation  . Citalopram Hydrobromide     REACTION: agitation  . Desvenlafaxine      REACTION: agitation  . Fluoxetine Hcl     REACTION: agitation  . Venlafaxine     REACTION: agitation   Family History  Problem Relation Age of Onset  . Diabetes Mother   . Prostate cancer Father   . Heart disease Father     Bypass  . Depression Sister   . Colon cancer Neg Hx   . Colon polyps Neg Hx   . Kidney disease Neg Hx   . Liver disease Neg Hx     Past medical history, social, surgical and family history all reviewed in electronic medical record.  No pertanent information unless stated regarding to the chief complaint.   Review of Systems: No headache, visual changes, nausea, vomiting, diarrhea, constipation, dizziness, abdominal pain, skin rash, fevers, chills, night sweats, weight loss, swollen lymph nodes, body aches, joint swelling, muscle aches, chest pain, shortness of breath, mood changes.   Objective  Blood pressure 118/84, pulse (!) 44, weight 161 lb (73 kg), SpO2 97 %.  General: No apparent distress alert and oriented x3 mood and affect normal, dressed appropriately.  HEENT: Pupils equal, extraocular movements intact  Respiratory: Patient's speak in full sentences and does not appear short of breath  Cardiovascular: No lower extremity edema, non tender, no erythema  Skin: Warm dry intact with no signs of infection or rash on extremities or on axial skeleton.  Abdomen: Soft nontender  Neuro: Cranial nerves II  through XII are intact, neurovascularly intact in all extremities with 2+ DTRs and 2+ pulses.  Lymph: No lymphadenopathy of posterior or anterior cervical chain or axillae bilaterally.  Gait normal with good balance and coordination.  MSK:  Non tender with full range of motion and good stability and symmetric strength and tone of shoulders, elbows, wrist, knee and ankles bilaterally.  [id number] ROM IR: 25 Deg, ER: 45 Deg, Flexion: 120 Deg, Extension: 100 Deg, Abduction: 45 Deg, Adduction: 45 Deg Strength IR: 5/5, ER: 5/5, Flexion: 5/5, Extension: 5/5,  Abduction: 5/5, Adduction: 5/5 Pelvic alignment unremarkable to inspection and palpation. Standing hip rotation and gait without trendelenburg sign / unsteadiness. Nontender over the greater trochanteric area. Patient does have some mild decrease in range of motion with Corky Sox test still. Nontender though with the Fabere test. Negative straight leg test.      Impression and Recommendations:     This case required medical decision making of moderate complexity.      Note: This dictation was prepared with Dragon dictation along with smaller phrase technology. Any transcriptional errors that result from this process are unintentional.

## 2016-02-21 ENCOUNTER — Encounter: Payer: Self-pay | Admitting: Family Medicine

## 2016-02-21 ENCOUNTER — Ambulatory Visit (INDEPENDENT_AMBULATORY_CARE_PROVIDER_SITE_OTHER): Payer: 59 | Admitting: Family Medicine

## 2016-02-21 DIAGNOSIS — M7062 Trochanteric bursitis, left hip: Secondary | ICD-10-CM | POA: Diagnosis not present

## 2016-02-21 NOTE — Assessment & Plan Note (Signed)
Unremarkably well. No need in change in management continue current therapy. We'll refill anti-inflammatory as needed. Follow-up as needed.

## 2016-02-21 NOTE — Patient Instructions (Signed)
Good to see you   

## 2016-02-28 ENCOUNTER — Ambulatory Visit (INDEPENDENT_AMBULATORY_CARE_PROVIDER_SITE_OTHER): Payer: 59

## 2016-02-28 DIAGNOSIS — Z23 Encounter for immunization: Secondary | ICD-10-CM

## 2016-02-28 NOTE — Addendum Note (Signed)
Addended by: Ander Slade on: 02/28/2016 01:35 PM   Modules accepted: Orders

## 2016-08-08 ENCOUNTER — Ambulatory Visit (INDEPENDENT_AMBULATORY_CARE_PROVIDER_SITE_OTHER): Payer: 59 | Admitting: Gastroenterology

## 2016-08-08 ENCOUNTER — Encounter: Payer: Self-pay | Admitting: Gastroenterology

## 2016-08-08 VITALS — BP 116/84 | HR 68 | Ht 66.0 in | Wt 163.5 lb

## 2016-08-08 DIAGNOSIS — K581 Irritable bowel syndrome with constipation: Secondary | ICD-10-CM | POA: Diagnosis not present

## 2016-08-08 DIAGNOSIS — R1011 Right upper quadrant pain: Secondary | ICD-10-CM | POA: Diagnosis not present

## 2016-08-08 MED ORDER — NA SULFATE-K SULFATE-MG SULF 17.5-3.13-1.6 GM/177ML PO SOLN: ORAL | 0 refills | Status: DC

## 2016-08-08 NOTE — Progress Notes (Signed)
     [facility] Progress Note  Chief Complaint: Abdominal pain and constipation  Subjective  History:  This is a 51 year old man who last saw Dr. Deatra Ina in November 2014 for rectal bleeding. It was determined to be hemorrhoidal in nature, and he underwent hemorrhoidal banding of the RA in December 2014 and left lateral hemorrhoid plexus January 2015. The patient's last colonoscopy was in 2008.  He has suffered from IBS for many years with cramps and alternating bowel habits. Over the last 4-6 months he has had more frequent and intense episodes of both right and left upper quadrant cramps. This seemed to occur more often in the evening after meals. He had some leftover Levsin, but it has not been of much help. He is now having pretty consistent constipation which is a change from before. There is no rectal bleeding and he has had no prior abdominal surgery. No family history of IBD or colorectal cancer.  ROS: Cardiovascular:  no chest pain Respiratory: no dyspnea  The patient's Past Medical, Family and Social History were reviewed and are on file in the EMR.  Objective:  Med list reviewed  Vital signs in last 24 hrs: Vitals:   08/08/16 0949  BP: 116/84  Pulse: 68    Physical Exam  He is well-appearing  HEENT: sclera anicteric, oral mucosa moist without lesions  Neck: supple, no thyromegaly, JVD or lymphadenopathy  Cardiac: RRR without murmurs, S1S2 heard, no peripheral edema  Pulm: clear to auscultation bilaterally, normal RR and effort noted  Abdomen: soft, no tenderness, with active bowel sounds. No guarding or palpable hepatosplenomegaly.  Skin; warm and dry, no jaundice or rash    @ASSESSMENTPLANBEGIN @ Assessment: Encounter Diagnoses  Name Primary?  . Irritable bowel syndrome with constipation Yes  . RUQ abdominal pain    While he carries a diagnosis of IBS, his symptoms have worsened and his bowel habits changed. It has been nearly 10 years since  his last colonoscopy, and I advised him to have another to rule out obstructive or neoplastic causes. I wonder if he developed diverticulosis that might account for his less frequent and thin stools.  He has not having recurrent bleeding to suggest active hemorrhoids. Plan: Daily stool softener and one half capful of MiraLAX.  I advised against antispasmodic therapy as it may worsen his constipation.  Total time 25 minutes, over half spent in counseling and coordination of care.   [name]

## 2016-08-08 NOTE — Patient Instructions (Addendum)
Please purchase the following medications over the counter and take as directed: Ducosate 100 mg capsule (stool softener) twice daily.  One half capful of mirlax daily.  Stop if this makes the stool too loose.  You have been scheduled for a colonoscopy. Please follow written instructions given to you at your visit today.  Please pick up your prep supplies at the pharmacy within the next 1-3 days. If you use inhalers (even only as needed), please bring them with you on the day of your procedure. Your physician has requested that you go to www.startemmi.com and enter the access code given to you at your visit today. This web site gives a general overview about your procedure. However, you should still follow specific instructions given to you by our office regarding your preparation for the procedure.  If you are age 51 or older, your body mass index should be between 23-30. Your Body mass index is 26.39 kg/m. If this is out of the aforementioned range listed, please consider follow up with your Primary Care Provider.  If you are age 59 or younger, your body mass index should be between 19-25. Your Body mass index is 26.39 kg/m. If this is out of the aformentioned range listed, please consider follow up with your Primary Care Provider.

## 2016-08-25 ENCOUNTER — Encounter: Payer: Self-pay | Admitting: Gastroenterology

## 2016-09-08 ENCOUNTER — Ambulatory Visit (AMBULATORY_SURGERY_CENTER): Payer: 59 | Admitting: Gastroenterology

## 2016-09-08 ENCOUNTER — Encounter: Payer: Self-pay | Admitting: Gastroenterology

## 2016-09-08 VITALS — BP 111/73 | HR 54 | Temp 98.4°F | Resp 21 | Ht 66.0 in | Wt 163.0 lb

## 2016-09-08 DIAGNOSIS — K5901 Slow transit constipation: Secondary | ICD-10-CM

## 2016-09-08 DIAGNOSIS — R1031 Right lower quadrant pain: Secondary | ICD-10-CM

## 2016-09-08 DIAGNOSIS — K5900 Constipation, unspecified: Secondary | ICD-10-CM | POA: Diagnosis not present

## 2016-09-08 DIAGNOSIS — G8929 Other chronic pain: Secondary | ICD-10-CM | POA: Diagnosis not present

## 2016-09-08 DIAGNOSIS — D120 Benign neoplasm of cecum: Secondary | ICD-10-CM | POA: Diagnosis not present

## 2016-09-08 MED ORDER — LINACLOTIDE 145 MCG PO CAPS: 145.0000 ug | ORAL_CAPSULE | Freq: Every day | ORAL | 0 refills | Status: DC

## 2016-09-08 MED ORDER — SODIUM CHLORIDE 0.9 % IV SOLN: 500.0000 mL | INTRAVENOUS | Status: DC

## 2016-09-08 NOTE — Progress Notes (Signed)
No problems noted in the recovery room. maw 

## 2016-09-08 NOTE — Op Note (Signed)
[facility] Patient Name: Micheal Grant Procedure Date: 09/08/2016 2:14 PM MRN: [id number] Endoscopist: Mallie Mussel L. Loletha Carrow , MD Age: 51 Referring MD:  Date of Birth: May 30, 1965 Gender: Male Account #: [id number] Procedure:                Colonoscopy Indications:              Abdominal pain in the right lower quadrant,                            Constipation Medicines:                Monitored Anesthesia Care Procedure:                Pre-Anesthesia Assessment:                           - Prior to the procedure, a History and Physical                            was performed, and patient medications and                            allergies were reviewed. The patient's tolerance of                            previous anesthesia was also reviewed. The risks                            and benefits of the procedure and the sedation                            options and risks were discussed with the patient.                            All questions were answered, and informed consent                            was obtained. Prior Anticoagulants: The patient has                            taken no previous anticoagulant or antiplatelet                            agents. ASA Grade Assessment: II - A patient with                            mild systemic disease. After reviewing the risks                            and benefits, the patient was deemed in                            satisfactory condition to undergo the procedure.  After obtaining informed consent, the colonoscope                            was passed under direct vision. Throughout the                            procedure, the patient's blood pressure, pulse, and                            oxygen saturations were monitored continuously. The                            Colonoscope was introduced through the anus and                            advanced to the the terminal ileum. The colonoscopy                             was performed without difficulty. The patient                            tolerated the procedure well. The quality of the                            bowel preparation was excellent. The terminal                            ileum, ileocecal valve, appendiceal orifice, and                            rectum were photographed. The quality of the bowel                            preparation was evaluated using the BBPS [facility]                            Bowel Preparation Scale) with scores of: Right                            Colon = 3, Transverse Colon = 3 and Left Colon = 3                            (entire mucosa seen well with no residual staining,                            small fragments of stool or opaque liquid). The                            total BBPS score equals 9. The bowel preparation                            used was SUPREP. Scope In: 2:28:37 PM Scope Out: 2:39:45 PM Scope Withdrawal Time: 0  hours 9 minutes 13 seconds  Total Procedure Duration: 0 hours 11 minutes 8 seconds  Findings:                 The perianal and digital rectal examinations were                            normal.                           A 1 mm polyp was found in the cecum. The polyp was                            sessile. The polyp was removed with a cold biopsy                            forceps. Resection and retrieval were complete.                           Multiple small and large-mouthed diverticula were                            found in the entire colon, most notably in the                            sigmoid colon.                           The exam was otherwise without abnormality on                            direct and retroflexion views. Complications:            No immediate complications. Estimated Blood Loss:     Estimated blood loss: none. Impression:               - One 1 mm polyp in the cecum, removed with a cold                            biopsy forceps.  Resected and retrieved.                           - Diverticulosis in the entire examined colon.                           - The examination was otherwise normal on direct                            and retroflexion views. Recommendation:           - Patient has a contact number available for                            emergencies. The signs and symptoms of potential                            delayed  complications were discussed with the                            patient. Return to normal activities tomorrow.                            Written discharge instructions were provided to the                            patient.                           - Resume previous diet.                           - Continue present medications.                           - Await pathology results.                           - Repeat colonoscopy is recommended for                            surveillance. The colonoscopy date will be                            determined after pathology results from today's                            exam become available for review.                           - Use Linzess (linaclotide) 145 mcg PO daily for 2                            weeks. Disp #14, RF zero. Begin the day after                            tomorrow. Micheal Grant L. Loletha Carrow, MD 09/08/2016 2:44:54 PM This report has been signed electronically.

## 2016-09-08 NOTE — Patient Instructions (Signed)
YOU HAD AN ENDOSCOPIC PROCEDURE TODAY AT [facility]:   Refer to the procedure report that was given to you for any specific questions about what was found during the examination.  If the procedure report does not answer your questions, please call your gastroenterologist to clarify.  If you requested that your care partner not be given the details of your procedure findings, then the procedure report has been included in a sealed envelope for you to review at your convenience later.  YOU SHOULD EXPECT: Some feelings of bloating in the abdomen. Passage of more gas than usual.  Walking can help get rid of the air that was put into your GI tract during the procedure and reduce the bloating. If you had a lower endoscopy (such as a colonoscopy or flexible sigmoidoscopy) you may notice spotting of blood in your stool or on the toilet paper. If you underwent a bowel prep for your procedure, you may not have a normal bowel movement for a few days.  Please Note:  You might notice some irritation and congestion in your nose or some drainage.  This is from the oxygen used during your procedure.  There is no need for concern and it should clear up in a day or so.  SYMPTOMS TO REPORT IMMEDIATELY:   Following lower endoscopy (colonoscopy or flexible sigmoidoscopy):  Excessive amounts of blood in the stool  Significant tenderness or worsening of abdominal pains  Swelling of the abdomen that is new, acute  Fever of 100F or higher   For urgent or emergent issues, a gastroenterologist can be reached at any hour by calling [phone].   DIET:  We do recommend a small meal at first, but then you may proceed to your regular diet.  Drink plenty of fluids but you should avoid alcoholic beverages for 24 hours.  ACTIVITY:  You should plan to take it easy for the rest of today and you should NOT DRIVE or use heavy machinery until tomorrow (because of the sedation medicines used during the test).     FOLLOW UP: Our staff will call the number listed on your records the next business day following your procedure to check on you and address any questions or concerns that you may have regarding the information given to you following your procedure. If we do not reach you, we will leave a message.  However, if you are feeling well and you are not experiencing any problems, there is no need to return our call.  We will assume that you have returned to your regular daily activities without incident.  If any biopsies were taken you will be contacted by phone or by letter within the next 1-3 weeks.  Please call us at [phone] if you have not heard about the biopsies in 3 weeks.    SIGNATURES/CONFIDENTIALITY: You and/or your care partner have signed paperwork which will be entered into your electronic medical record.  These signatures attest to the fact that that the information above on your After Visit Summary has been reviewed and is understood.  Full responsibility of the confidentiality of this discharge information lies with you and/or your care-partner.    Handouts were given to your care partner on polyps and diverticulosis. Rx was sent to [facility] [employer] 145 mcg daily for 2 weeks beginning the day after tomorrow. You may resume your other current medications today. Await biopsy results. Please call if any questions or concerns.

## 2016-09-08 NOTE — Progress Notes (Signed)
Called to room to assist during endoscopic procedure.  Patient ID and intended procedure confirmed with present staff. Received instructions for my participation in the procedure from the performing physician.  

## 2016-09-08 NOTE — Progress Notes (Signed)
Patient awakening,vss,report to rn 

## 2016-09-09 ENCOUNTER — Telehealth: Payer: Self-pay | Admitting: *Deleted

## 2016-09-09 ENCOUNTER — Telehealth: Payer: Self-pay

## 2016-09-09 NOTE — Telephone Encounter (Signed)
  Follow up Call-  Call back number 09/08/2016  Post procedure Call Back phone  # [phone]  Permission to leave phone message Yes  Some recent data might be hidden     Patient questions:  Do you have a fever, pain , or abdominal swelling? No. Pain Score  0 *  Have you tolerated food without any problems? Yes.    Have you been able to return to your normal activities? Yes.    Do you have any questions about your discharge instructions: Diet   No. Medications  No. Follow up visit  No.  Do you have questions or concerns about your Care? No.  Actions: * If pain score is 4 or above: No action needed, pain <4.

## 2016-09-09 NOTE — Telephone Encounter (Signed)
  Follow up Call-  Call back number 09/08/2016  Post procedure Call Back phone  # [phone]  Permission to leave phone message Yes  Some recent data might be hidden     Patient questions:  Message left to call us if necessary.

## 2016-09-12 ENCOUNTER — Encounter: Payer: Self-pay | Admitting: Gastroenterology

## 2016-10-08 ENCOUNTER — Encounter: Payer: Self-pay | Admitting: Gastroenterology

## 2016-10-08 ENCOUNTER — Ambulatory Visit (INDEPENDENT_AMBULATORY_CARE_PROVIDER_SITE_OTHER): Payer: 59 | Admitting: Gastroenterology

## 2016-10-08 VITALS — BP 104/76 | HR 52 | Ht 66.0 in | Wt 164.1 lb

## 2016-10-08 DIAGNOSIS — K580 Irritable bowel syndrome with diarrhea: Secondary | ICD-10-CM

## 2016-10-08 DIAGNOSIS — R1031 Right lower quadrant pain: Secondary | ICD-10-CM | POA: Diagnosis not present

## 2016-10-08 DIAGNOSIS — K5730 Diverticulosis of large intestine without perforation or abscess without bleeding: Secondary | ICD-10-CM

## 2016-10-08 NOTE — Progress Notes (Signed)
     Micheal Grant Progress Note  Chief Complaint: Right lower quadrant pain and constipation  Subjective  History:  Micheal Grant follows up after his recent colonoscopy. See April office note for details. He continues to have intermittent dull and crampy right-sided abdominal pain that feels like "a balloon". This sometimes bothers him at night and keeps him from sleep. It is nonradiating with no clear exacerbation by activity. He still has alternating diarrhea and constipation. When questioned further, the constipation is really the urge for bowel movement where he may have little or none. He may have a small movement, then need to go again a little later. This alternates with watery stool. He did not find Linzess was helpful, it only made his stool more loose. Colonoscopy revealed pandiverticulosis and a small cecal tubular adenoma. He had not previously had improvement on Levsin.  ROS: Cardiovascular:  no chest pain Respiratory: no dyspnea  The patient's Past Medical, Family and Social History were reviewed and are on file in the EMR.  Objective:  Med list reviewed  Vital signs in last 24 hrs: Vitals:   10/08/16 0825  BP: 104/76  Pulse: (!) 52    Physical Exam    HEENT: sclera anicteric, oral mucosa moist without lesions  Neck: supple, no thyromegaly, JVD or lymphadenopathy  Cardiac: RRR without murmurs, S1S2 heard, no peripheral edema  Pulm: clear to auscultation bilaterally, normal RR and effort noted  Abdomen: soft, No tenderness, with active bowel sounds. No guarding or palpable hepatosplenomegaly.  Skin; warm and dry, no jaundice or rash  Recent Labs:  Pathology report as noted above    @ASSESSMENTPLANBEGIN @ Assessment: Encounter Diagnoses  Name Primary?  . Irritable bowel syndrome with diarrhea Yes  . Diverticulosis of colon   . RLQ abdominal pain    He has years of IBS symptoms that are predominantly diarrhea. The reported intermittent thin stools  appeared to be from diverticulosis. We discussed the nature of IBS again, as well as the available therapies. These include fiber and hyoscyamine (neither which has been helpful in the past), rifaximin is of uncertain benefit and is also expensive.Viberzi does not seem to be warranted with the degree of diarrhea he is describing. It remains an option since he has not had a cholecystectomy.   Plan:  He has opted to make further efforts at healthy diet and continued use of probiotics. He would like to do a little more research about the options above. I will see him as needed.  Surveillance colonoscopy in 5 years  Total time 30 minutes, over half spent in counseling and coordination of care.   [name]

## 2016-10-08 NOTE — Patient Instructions (Signed)
If you are age 53 or older, your body mass index should be between 23-30. Your Body mass index is 26.49 kg/m. If this is out of the aforementioned range listed, please consider follow up with your Primary Care Provider.  If you are age 18 or younger, your body mass index should be between 19-25. Your Body mass index is 26.49 kg/m. If this is out of the aformentioned range listed, please consider follow up with your Primary Care Provider.   Thank you for choosing [facility]  Dr Wilfrid Lund III

## 2016-10-20 ENCOUNTER — Encounter: Payer: Self-pay | Admitting: Internal Medicine

## 2016-10-21 MED ORDER — LORAZEPAM 0.5 MG PO TABS: 0.5000 mg | ORAL_TABLET | Freq: Every day | ORAL | 2 refills | Status: DC | PRN

## 2016-10-21 NOTE — Telephone Encounter (Signed)
Done hardcopy to [facility]  

## 2016-10-23 ENCOUNTER — Other Ambulatory Visit: Payer: Self-pay | Admitting: Internal Medicine

## 2016-10-23 MED ORDER — ZOLPIDEM TARTRATE 10 MG PO TABS: ORAL_TABLET | ORAL | 1 refills | Status: DC

## 2016-10-23 NOTE — Telephone Encounter (Signed)
Faxed

## 2016-10-23 NOTE — Telephone Encounter (Signed)
Done hardcopy to [facility]  

## 2016-11-04 NOTE — Telephone Encounter (Signed)
Pt did not get this hard copy. He asked that it be faxed along with the Zolpidem. Can we fax this prescription over to [facility] for him?

## 2016-11-12 ENCOUNTER — Other Ambulatory Visit: Payer: Self-pay | Admitting: Internal Medicine

## 2016-11-13 NOTE — Telephone Encounter (Signed)
Ativan refill too soon as 3 mo total rx was just done in late June 2018

## 2016-12-01 ENCOUNTER — Encounter: Payer: Self-pay | Admitting: Internal Medicine

## 2016-12-02 MED ORDER — LORAZEPAM 0.5 MG PO TABS: 0.5000 mg | ORAL_TABLET | Freq: Every day | ORAL | 2 refills | Status: DC | PRN

## 2016-12-02 NOTE — Telephone Encounter (Signed)
Done hardcopy to [facility]  

## 2016-12-12 NOTE — Telephone Encounter (Signed)
Pt is calling about getting the script ASAP.  He stated he never got this script   [phone]

## 2017-02-04 ENCOUNTER — Other Ambulatory Visit (INDEPENDENT_AMBULATORY_CARE_PROVIDER_SITE_OTHER): Payer: 59

## 2017-02-04 ENCOUNTER — Encounter: Payer: Self-pay | Admitting: Internal Medicine

## 2017-02-04 ENCOUNTER — Ambulatory Visit (INDEPENDENT_AMBULATORY_CARE_PROVIDER_SITE_OTHER)
Admission: RE | Admit: 2017-02-04 | Discharge: 2017-02-04 | Disposition: A | Discharge status: Home / Self Care | Payer: 59 | Source: Ambulatory Visit | Attending: Internal Medicine | Admitting: Internal Medicine

## 2017-02-04 ENCOUNTER — Ambulatory Visit (INDEPENDENT_AMBULATORY_CARE_PROVIDER_SITE_OTHER): Payer: 59 | Admitting: Internal Medicine

## 2017-02-04 VITALS — BP 110/68 | HR 62 | Temp 98.3°F | Ht 66.0 in | Wt 162.0 lb

## 2017-02-04 DIAGNOSIS — Z23 Encounter for immunization: Secondary | ICD-10-CM

## 2017-02-04 DIAGNOSIS — Z0001 Encounter for general adult medical examination with abnormal findings: Secondary | ICD-10-CM | POA: Diagnosis not present

## 2017-02-04 DIAGNOSIS — R001 Bradycardia, unspecified: Secondary | ICD-10-CM | POA: Diagnosis not present

## 2017-02-04 DIAGNOSIS — R079 Chest pain, unspecified: Secondary | ICD-10-CM

## 2017-02-04 DIAGNOSIS — Z114 Encounter for screening for human immunodeficiency virus [HIV]: Secondary | ICD-10-CM | POA: Diagnosis not present

## 2017-02-04 DIAGNOSIS — F411 Generalized anxiety disorder: Secondary | ICD-10-CM | POA: Diagnosis not present

## 2017-02-04 DIAGNOSIS — R7302 Impaired glucose tolerance (oral): Secondary | ICD-10-CM | POA: Diagnosis not present

## 2017-02-04 LAB — URINALYSIS, ROUTINE W REFLEX MICROSCOPIC
BILIRUBIN URINE: NEGATIVE
HGB URINE DIPSTICK: NEGATIVE
Ketones, ur: NEGATIVE
LEUKOCYTES UA: NEGATIVE
NITRITE: NEGATIVE
PH: 6.5 (ref 5.0–8.0)
RBC / HPF: NONE SEEN (ref 0–?)
Specific Gravity, Urine: 1.01 (ref 1.000–1.030)
Total Protein, Urine: NEGATIVE
Urine Glucose: NEGATIVE
Urobilinogen, UA: 0.2 (ref 0.0–1.0)
WBC, UA: NONE SEEN (ref 0–?)

## 2017-02-04 LAB — HEPATIC FUNCTION PANEL
ALBUMIN: 4.7 g/dL (ref 3.5–5.2)
ALK PHOS: 40 U/L (ref 39–117)
ALT: 33 U/L (ref 0–53)
AST: 26 U/L (ref 0–37)
Bilirubin, Direct: 0.1 mg/dL (ref 0.0–0.3)
TOTAL PROTEIN: 6.9 g/dL (ref 6.0–8.3)
Total Bilirubin: 0.5 mg/dL (ref 0.2–1.2)

## 2017-02-04 LAB — CBC WITH DIFFERENTIAL/PLATELET
BASOS ABS: 0.1 10*3/uL (ref 0.0–0.1)
Basophils Relative: 1.3 % (ref 0.0–3.0)
EOS PCT: 3.7 % (ref 0.0–5.0)
Eosinophils Absolute: 0.2 10*3/uL (ref 0.0–0.7)
HEMATOCRIT: 46.3 % (ref 39.0–52.0)
HEMOGLOBIN: 15.5 g/dL (ref 13.0–17.0)
LYMPHS ABS: 1.8 10*3/uL (ref 0.7–4.0)
LYMPHS PCT: 36.7 % (ref 12.0–46.0)
MCHC: 33.4 g/dL (ref 30.0–36.0)
MCV: 93.5 fl (ref 78.0–100.0)
MONOS PCT: 11.2 % (ref 3.0–12.0)
Monocytes Absolute: 0.5 10*3/uL (ref 0.1–1.0)
Neutro Abs: 2.3 10*3/uL (ref 1.4–7.7)
Neutrophils Relative %: 47.1 % (ref 43.0–77.0)
Platelets: 240 10*3/uL (ref 150.0–400.0)
RBC: 4.95 Mil/uL (ref 4.22–5.81)
RDW: 12.8 % (ref 11.5–15.5)
WBC: 4.8 10*3/uL (ref 4.0–10.5)

## 2017-02-04 LAB — LIPID PANEL
CHOLESTEROL: 206 mg/dL — AB (ref 0–200)
HDL: 54.4 mg/dL (ref >39.00)
LDL CALC: 125 mg/dL — AB (ref 0–99)
NONHDL: 151.37
Total CHOL/HDL Ratio: 4
Triglycerides: 133 mg/dL (ref 0.0–149.0)
VLDL: 26.6 mg/dL (ref 0.0–40.0)

## 2017-02-04 LAB — PSA: PSA: 1.92 ng/mL (ref 0.10–4.00)

## 2017-02-04 LAB — BASIC METABOLIC PANEL
BUN: 11 mg/dL (ref 6–23)
CALCIUM: 10.1 mg/dL (ref 8.4–10.5)
CO2: 30 meq/L (ref 19–32)
CREATININE: 1.07 mg/dL (ref 0.40–1.50)
Chloride: 103 mEq/L (ref 96–112)
GFR: 76.71 mL/min (ref >60.00)
GLUCOSE: 102 mg/dL — AB (ref 70–99)
Potassium: 4.6 mEq/L (ref 3.5–5.1)
SODIUM: 139 meq/L (ref 135–145)

## 2017-02-04 LAB — TSH: TSH: 2.94 u[IU]/mL (ref 0.35–4.50)

## 2017-02-04 MED ORDER — ESCITALOPRAM OXALATE 10 MG PO TABS: 10.0000 mg | ORAL_TABLET | Freq: Every day | ORAL | 3 refills | Status: DC

## 2017-02-04 MED ORDER — LORAZEPAM 0.5 MG PO TABS: 0.5000 mg | ORAL_TABLET | Freq: Every day | ORAL | 2 refills | Status: DC | PRN

## 2017-02-04 NOTE — Assessment & Plan Note (Signed)
Asympt, for f/u a1c, cont wt control

## 2017-02-04 NOTE — Patient Instructions (Signed)
Your EKG was OK today  Please take all new medication as prescribed - the lexapro 10 mg per day  Please continue all other medications as before, and refills have been done if requested - the lorazepam  Please have the pharmacy call with any other refills you may need.  Please continue your efforts at being more active, low cholesterol diet, and weight control.  You are otherwise up to date with prevention measures today.  Please keep your appointments with your specialists as you may have planned  You will be contacted regarding the referral for: Stress test  Please go to the XRAY Department in the Basement (go straight as you get off the elevator) for the x-ray testing  Please go to the LAB in the Basement (turn left off the elevator) for the tests to be done today  You will be contacted by phone if any changes need to be made immediately.  Otherwise, you will receive a letter about your results with an explanation, but please check with MyChart first.  Please remember to sign up for MyChart if you have not done so, as this will be important to you in the future with finding out test results, communicating by private email, and scheduling acute appointments online when needed.  Please return in 1 year for your yearly visit, or sooner if needed, with Lab testing done 3-5 days before

## 2017-02-04 NOTE — Progress Notes (Signed)
Subjective:    Patient ID: Micheal Grant, male    DOB: 12-Jul-1965, 51 y.o.   MRN: [id number]  HPI  Here for wellness and f/u;  Overall doing ok;  Pt denies wheezing, orthopnea, PND, worsening LE edema, palpitations, dizziness or syncope.  Pt denies neurological change such as new headache, facial or extremity weakness.  Pt denies polydipsia, polyuria, or low sugar symptoms. Pt states overall good compliance with treatment and medications, good tolerability, and has been trying to follow appropriate diet.  Pt denies worsening depressive symptoms, suicidal ideation. . No fever, night sweats, wt loss, loss of appetite, or other constitutional symptoms.  Pt states good ability with ADL's, has low fall risk, home safety reviewed and adequate, no other significant changes in hearing or vision, and occasionally active with exercise. Also: Had ? Anxiety attack; was doing well last wed, went to the gym, pushed hiself on the cardio more than usual, later in shower at home had a brief sharp but at least mod severe mid sternal CP, resolved but over next 2-3 days had intermittent sore feeling; on Satuday was fatigued, had not slep well the night before, admits to pizza and beer as his diet though he normally tries to eat more health; later that night became anxious and tense that really started getting to him over a few minutes, and then had 30 minutes SSCP dull tightness, without radiation, some sob, slight nausea, no vomiting, had an episode of sweat and clammy, did feel palpitations with faster HR but no skipped beats, and dizzy in that he had a "head rush" with trying to stand and had to sit back down for a few minutes.  Later that evening with tingling in jaw and right leg, not clear if related.   Past Medical History:  Diagnosis Date  . ABDOMINAL PAIN RIGHT LOWER QUADRANT 08/27/2009  . ADD 07/08/2007  . ANXIETY 07/08/2007  . DEPRESSION 07/08/2007  . DIVERTICULOSIS, COLON 07/08/2007  . ERECTILE DYSFUNCTION  07/08/2007  . 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 FATIGUE 03/09/2008  . GLUCOSE INTOLERANCE 07/08/2007  . HYPERLIPIDEMIA 07/08/2007  . IBS 07/08/2007  . INSOMNIA-SLEEP DISORDER-UNSPEC 07/08/2007  . Rash and other nonspecific skin eruption 05/23/2008  . Throat pain 01/23/2010   Past Surgical History:  Procedure Laterality Date  . COLONOSCOPY    . none      reports that he has never smoked. He has never used smokeless tobacco. He reports that he drinks alcohol. He reports that he does not use drugs. family history includes Depression in his sister; Diabetes in his mother; Heart disease in his father; Prostate cancer in his father. Allergies  Allergen Reactions  . Bupropion Hcl     REACTION: agitation  . Citalopram Hydrobromide     REACTION: agitation  . Desvenlafaxine     REACTION: agitation  . Fluoxetine Hcl     REACTION: agitation  . Venlafaxine     REACTION: agitation   Current Outpatient Prescriptions on File Prior to Visit  Medication Sig Dispense Refill  . Diclofenac Sodium (PENNSAID) 2 % SOLN Place 2 application onto the skin 2 (two) times daily. 112 g 3  . hyoscyamine (LEVSIN, ANASPAZ) 0.125 MG tablet Take 0.125 mg by mouth as needed.    . linaclotide (LINZESS) 145 MCG CAPS capsule Take 1 capsule (145 mcg total) by mouth daily before breakfast. 14 capsule 0  . zolpidem (AMBIEN) 10 MG tablet TAKE ONE TABLET BY MOUTH AT BEDTIME AS NEEDED 90 tablet 1  No current facility-administered medications on file prior to visit.    Review of Systems Constitutional: Negative for other unusual diaphoresis, sweats, appetite or weight changes HENT: Negative for other worsening hearing loss, ear pain, facial swelling, mouth sores or neck stiffness.   Eyes: Negative for other worsening pain, redness or other visual disturbance.  Respiratory: Negative for other stridor or swelling Cardiovascular: Negative for other palpitations or other chest pain  Gastrointestinal: Negative for  worsening diarrhea or loose stools, blood in stool, distention or other pain Genitourinary: Negative for hematuria, flank pain or other change in urine volume.  Musculoskeletal: Negative for myalgias or other joint swelling.  Skin: Negative for other color change, or other wound or worsening drainage.  Neurological: Negative for other syncope or numbness. Hematological: Negative for other adenopathy or swelling Psychiatric/Behavioral: Negative for hallucinations, other worsening agitation, SI, self-injury, or new decreased concentration All other system neg per pt    Objective:   Physical Exam BP 110/68   Pulse 62   Temp 98.3 F (36.8 C) (Oral)   Ht 5\' 6"  (1.676 m)   Wt 162 lb (73.5 kg)   SpO2 100%   BMI 26.15 kg/m  VS noted,  Constitutional: Pt is oriented to person, place, and time. Appears well-developed and well-nourished, in no significant distress and comfortable Head: Normocephalic and atraumatic  Eyes: Conjunctivae and EOM are normal. Pupils are equal, round, and reactive to light Right Ear: External ear normal without discharge Left Ear: External ear normal without discharge Nose: Nose without discharge or deformity Mouth/Throat: Oropharynx is without other ulcerations and moist  Neck: Normal range of motion. Neck supple. No JVD present. No tracheal deviation present or significant neck LA or mass Cardiovascular: Normal rate, regular rhythm, normal heart sounds and intact distal pulses.   Pulmonary/Chest: WOB normal and breath sounds without rales or wheezing  Abdominal: Soft. Bowel sounds are normal. NT. No HSM  Musculoskeletal: Normal range of motion. Exhibits no edema Lymphadenopathy: Has no other cervical adenopathy.  Neurological: Pt is alert and oriented to person, place, and time. Pt has normal reflexes. No cranial nerve deficit. Motor grossly intact, Gait intact Skin: Skin is warm and dry. No rash noted or new ulcerations Psychiatric:  Has nervous mood and affect.  Behavior is normal without agitation No other exam findings  ECG today I have personally interpreted Marked sinus bradycardia 33    Assessment & Plan:

## 2017-02-04 NOTE — Assessment & Plan Note (Signed)
Long standing issue, had echo and cardiology eval 2015 noting this but no specific tx needed, to cont to follow

## 2017-02-04 NOTE — Assessment & Plan Note (Signed)
To start lexapro 10 qd, cont lorazepam prn only

## 2017-02-04 NOTE — Assessment & Plan Note (Addendum)
Atypical, etiology unclear, for cxr, ECG reviewed without ischemic changes, for stress test  In addition to the time spent performing CPE, I spent an additional 25 minutes face to face,in which greater than 50% of this time was spent in counseling and coordination of care for patient's acute illness as documented, including the differential dx, tx, further evaluation and other management of CP, bradycardia, anxiety and hyperglycemie

## 2017-02-04 NOTE — Assessment & Plan Note (Signed)

## 2017-02-05 LAB — HIV ANTIBODY (ROUTINE TESTING W REFLEX): HIV 1&2 Ab, 4th Generation: NONREACTIVE

## 2017-02-18 ENCOUNTER — Telehealth (HOSPITAL_COMMUNITY): Payer: Self-pay | Admitting: Internal Medicine

## 2017-02-19 NOTE — Telephone Encounter (Signed)
User: Cherie Dark A Date/time: 02/18/17 1:33 PM  Comment: Called pt and lmsg for him to CB to schedule a myoview.   Context:  Outcome: Left Message  Phone number: [phone] Phone Type: Home Phone  Comm. type: Telephone Call type: Outgoing  Contact: [place], Samantha Relation to patient: Self

## 2017-02-20 ENCOUNTER — Telehealth (HOSPITAL_COMMUNITY): Payer: Self-pay | Admitting: *Deleted

## 2017-02-20 NOTE — Telephone Encounter (Signed)
Patient given detailed instructions per Myocardial Perfusion Study Information Sheet for the test on 02/24/17 at 9:45. Patient notified to arrive 15 minutes early and that it is imperative to arrive on time for appointment to keep from having the test rescheduled.  If you need to cancel or reschedule your appointment, please call the office within 24 hours of your appointment. . Patient verbalized understanding.Micheal Grant

## 2017-02-24 ENCOUNTER — Ambulatory Visit (HOSPITAL_COMMUNITY): Payer: 59 | Attending: Cardiology

## 2017-02-24 DIAGNOSIS — R079 Chest pain, unspecified: Secondary | ICD-10-CM | POA: Diagnosis not present

## 2017-02-24 LAB — MYOCARDIAL PERFUSION IMAGING
CHL CUP MPHR: 167 {beats}/min
CHL CUP NUCLEAR SDS: 1
CSEPED: 10 min
Estimated workload: 11.6 METS
Exercise duration (sec): 0 s
LHR: 0.33
LVDIAVOL: 82 mL (ref 62–150)
LVSYSVOL: 37 mL
Peak HR: 155 {beats}/min
Percent HR: 92 %
Rest HR: 45 {beats}/min
SRS: 10
SSS: 11
TID: 0.99

## 2017-02-24 MED ORDER — TECHNETIUM TC 99M TETROFOSMIN IV KIT
10.1000 | PACK | Freq: Once | INTRAVENOUS | Status: AC | PRN
  Administered 2017-02-24: 10.1 via INTRAVENOUS
  Filled 2017-02-24: qty 11

## 2017-02-24 MED ORDER — TECHNETIUM TC 99M TETROFOSMIN IV KIT
31.7000 | PACK | Freq: Once | INTRAVENOUS | Status: AC | PRN
  Administered 2017-02-24: 31.7 via INTRAVENOUS
  Filled 2017-02-24: qty 32

## 2017-03-12 DIAGNOSIS — M7581 Other shoulder lesions, right shoulder: Secondary | ICD-10-CM | POA: Diagnosis not present

## 2017-05-29 ENCOUNTER — Encounter: Payer: Self-pay | Admitting: Internal Medicine

## 2017-05-29 MED ORDER — ZOLPIDEM TARTRATE 10 MG PO TABS: ORAL_TABLET | ORAL | 1 refills | Status: DC

## 2017-05-29 NOTE — Telephone Encounter (Signed)
Done erx to [facility] 

## 2017-06-05 DIAGNOSIS — M7581 Other shoulder lesions, right shoulder: Secondary | ICD-10-CM | POA: Diagnosis not present

## 2017-06-09 DIAGNOSIS — M25511 Pain in right shoulder: Secondary | ICD-10-CM | POA: Diagnosis not present

## 2017-06-11 DIAGNOSIS — M7581 Other shoulder lesions, right shoulder: Secondary | ICD-10-CM | POA: Diagnosis not present

## 2017-06-19 DIAGNOSIS — M25611 Stiffness of right shoulder, not elsewhere classified: Secondary | ICD-10-CM | POA: Diagnosis not present

## 2017-06-19 DIAGNOSIS — M25511 Pain in right shoulder: Secondary | ICD-10-CM | POA: Diagnosis not present

## 2017-06-19 DIAGNOSIS — S43429D Sprain of unspecified rotator cuff capsule, subsequent encounter: Secondary | ICD-10-CM | POA: Diagnosis not present

## 2017-06-30 DIAGNOSIS — S43429D Sprain of unspecified rotator cuff capsule, subsequent encounter: Secondary | ICD-10-CM | POA: Diagnosis not present

## 2017-06-30 DIAGNOSIS — M25511 Pain in right shoulder: Secondary | ICD-10-CM | POA: Diagnosis not present

## 2017-06-30 DIAGNOSIS — M25611 Stiffness of right shoulder, not elsewhere classified: Secondary | ICD-10-CM | POA: Diagnosis not present

## 2017-07-30 ENCOUNTER — Telehealth: Payer: Self-pay | Admitting: Gastroenterology

## 2017-07-30 NOTE — Telephone Encounter (Signed)
Spoke to patient and have scheduled him sooner with APP (4/10), patient not in severe pain just wanted to get in sooner. He was instructed to contact PCP or go to urgent care or ED if symptoms increase.

## 2017-08-05 ENCOUNTER — Ambulatory Visit: Payer: 59 | Admitting: Physician Assistant

## 2017-08-05 ENCOUNTER — Encounter: Payer: Self-pay | Admitting: Physician Assistant

## 2017-08-05 VITALS — BP 116/70 | HR 68 | Ht 66.0 in | Wt 164.2 lb

## 2017-08-05 DIAGNOSIS — R1031 Right lower quadrant pain: Secondary | ICD-10-CM | POA: Diagnosis not present

## 2017-08-05 DIAGNOSIS — K5900 Constipation, unspecified: Secondary | ICD-10-CM

## 2017-08-05 MED ORDER — DICYCLOMINE HCL 10 MG PO CAPS: 10.0000 mg | ORAL_CAPSULE | Freq: Two times a day (BID) | ORAL | 2 refills | Status: DC

## 2017-08-05 MED ORDER — LUBIPROSTONE 8 MCG PO CAPS: 8.0000 ug | ORAL_CAPSULE | Freq: Two times a day (BID) | ORAL | 1 refills | Status: DC

## 2017-08-05 NOTE — Progress Notes (Signed)
Thank you for sending this case to me. I have reviewed the entire note, and the outlined plan seems appropriate.  I am familiar with this patient.  If hyoscyamine was not mch help, not clear if dicyclomine will be.  Stop it if it seems to be making the constipation worse.  And if not substantially better next appointment, definitely obtain CT scan abdomen/pelvis.  Wilfrid Lund, MD

## 2017-08-05 NOTE — Patient Instructions (Addendum)
If you are age 54 or older, your body mass index should be between 23-30. Your Body mass index is 26.51 kg/m. If this is out of the aforementioned range listed, please consider follow up with your Primary Care Provider.  If you are age 68 or younger, your body mass index should be between 19-25. Your Body mass index is 26.51 kg/m. If this is out of the aformentioned range listed, please consider follow up with your Primary Care Provider.   We have sent the following medications to your pharmacy for you to pick up at your convenience: Amitiza 8 mcg Dicyclomine 10 mg  You have been given a Low FODMAP Diet.  Follow up appointment with Ellouise Newer, PA-C on Sep 10, 2017 at 1:45 pm.  Thank you for choosing me and [facility].   Ellouise Newer, PA-C

## 2017-08-05 NOTE — Progress Notes (Signed)
Chief Complaint: Right lower quadrant abdominal pain, constipation  HPI:    [name] is a 52 year old male, who follows with Dr. Loletha Carrow and presents to clinic today for continued right lower quadrant abdominal pain and constipation.     09/08/16 colonoscopy revealed pandiverticulosis and a small cecal tubular adenoma.    10/08/16 office visit Dr. Loletha Carrow continued with intermittent dull and crampy right-sided abdominal pain, also bothered him at night and kept him from sleeping, nonradiating, also constipation uhelped by Linzess.  Discussed IBS.  Offered hyoscyamine and fiber as well as rifaximin but patient opted to make further efforts at healthy diet and continued use of probiotics.    Today, explains that his symptoms have gotten worse since the holidays where he had a very bad diet.  Now right lower quadrant cramping is at least 8/10 and will often awaken him from.  Due to this patient has only been getting about 4 hours of sleep per night irregardless of recently starting zolpidem and lorazepam per his PCP as needed.  Describes that Hyoscyamine was tried a few years ago and patient cannot remember exactly if this helped or not.  Also tried Linzess which he used off and on for about a month and occasionally gave him watery stool but did not help with the pain.  Can be worsened by certain types of food, greasy fatty things tend to make this pain worse and he tries to avoid them.  Pain usually starts within 2 hours after eating.    Continues to describe 1-2 incomplete bowel movements per day which has gotten more frequent and is now almost every day over the past few months.    Denies eating very late at night.    Denies fever, chills, blood in his stool, melena, weight loss, anorexia, nausea, vomiting, heartburn or reflux.  Past Medical History:  Diagnosis Date  . ABDOMINAL PAIN RIGHT LOWER QUADRANT 08/27/2009  . ADD 07/08/2007  . ANXIETY 07/08/2007  . DEPRESSION 07/08/2007  . DIVERTICULOSIS,  COLON 07/08/2007  . ERECTILE DYSFUNCTION 07/08/2007  . 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 FATIGUE 03/09/2008  . GLUCOSE INTOLERANCE 07/08/2007  . HYPERLIPIDEMIA 07/08/2007  . IBS 07/08/2007  . INSOMNIA-SLEEP DISORDER-UNSPEC 07/08/2007  . Rash and other nonspecific skin eruption 05/23/2008  . Throat pain 01/23/2010    Past Surgical History:  Procedure Laterality Date  . COLONOSCOPY    . none      Current Outpatient Medications  Medication Sig Dispense Refill  . Diclofenac Sodium (PENNSAID) 2 % SOLN Place 2 application onto the skin 2 (two) times daily. 112 g 3  . hyoscyamine (LEVSIN, ANASPAZ) 0.125 MG tablet Take 0.125 mg by mouth as needed.    Marland Kitchen LORazepam (ATIVAN) 0.5 MG tablet Take 1 tablet (0.5 mg total) by mouth daily as needed for anxiety. 30 tablet 2  . zolpidem (AMBIEN) 10 MG tablet TAKE ONE TABLET BY MOUTH AT BEDTIME AS NEEDED 90 tablet 1   No current facility-administered medications for this visit.     Allergies as of 08/05/2017 - Review Complete 08/05/2017  Allergen Reaction Noted  . Bupropion hcl  03/09/2008  . Citalopram hydrobromide  03/09/2008  . Desvenlafaxine  03/09/2008  . Fluoxetine hcl  03/09/2008  . Venlafaxine  03/09/2008    Family History  Problem Relation Age of Onset  . Diabetes Mother   . Prostate cancer Father   . Heart disease Father        Bypass  . Depression Sister   .  Colon cancer Neg Hx   . Colon polyps Neg Hx   . Kidney disease Neg Hx   . Liver disease Neg Hx   . Esophageal cancer Neg Hx   . Rectal cancer Neg Hx   . Stomach cancer Neg Hx     Social History   Socioeconomic History  . Marital status: Single    Spouse name: Not on file  . Number of children: Not on file  . Years of education: Not on file  . Highest education level: Not on file  Occupational History  . Occupation: Primary school teacher: UNEMPLOYED  Social Needs  . Financial resource strain: Not on file  . Food insecurity:    Worry: Not on file     Inability: Not on file  . Transportation needs:    Medical: Not on file    Non-medical: Not on file  Tobacco Use  . Smoking status: Never Smoker  . Smokeless tobacco: Never Used  Substance and Sexual Activity  . Alcohol use: Yes    Comment: 1-2 drinks per week  . Drug use: No  . Sexual activity: Not on file  Lifestyle  . Physical activity:    Days per week: Not on file    Minutes per session: Not on file  . Stress: Not on file  Relationships  . Social connections:    Talks on phone: Not on file    Gets together: Not on file    Attends religious service: Not on file    Active member of club or organization: Not on file    Attends meetings of clubs or organizations: Not on file    Relationship status: Not on file  . Intimate partner violence:    Fear of current or ex partner: Not on file    Emotionally abused: Not on file    Physically abused: Not on file    Forced sexual activity: Not on file  Other Topics Concern  . Not on file  Social History Narrative  . Not on file    Review of Systems:    Constitutional: No weight loss, fever or chills Cardiovascular: No chest pain Respiratory: No SOB Gastrointestinal: See HPI and otherwise negative   Physical Exam:  Vital signs: BP 116/70 (BP Location: Left Arm, Patient Position: Sitting, Cuff Size: Normal)   Pulse 68   Ht 5\' 6"  (1.676 m)   Wt 164 lb 4 oz (74.5 kg)   BMI 26.51 kg/m   Constitutional:   Pleasant male appears to be in NAD, Well developed, Well nourished, alert and cooperative Respiratory: Respirations even and unlabored. Lungs clear to auscultation bilaterally.   No wheezes, crackles, or rhonchi.  Cardiovascular: Normal S1, S2. No MRG. Regular rate and rhythm. No peripheral edema, cyanosis or pallor.  Gastrointestinal:  Soft, nondistended, nontender. No rebound or guarding. Normal bowel sounds. No appreciable masses or hepatomegaly. Psychiatric: Demonstrates good judgement and reason without abnormal affect or  behaviors.  No recent labs or imaging.  Assessment: 1.  Right lower quadrant cramping: Worse at night, wakes him from sleep; consider IBS v=s other 2.  Constipation: Incomplete bowel movements, no help from as needed Linzess in the past, also with cramping above; most likely IBS vs diverticular disease  Plan: 1.  Discussed IBS in detail with the patient today.  Explained that we can try measures for this to see if it helps with his symptom of right lower quadrant pain.  If not would recommend a CT  abdomen pelvis for further evaluation as he has not had imaging of his abdomen since 2008. 2.  Recommend patient trial a low FODMAP diet for the next 6 weeks. 3.  Recommend increasing fiber to at least 25-35 g/day with the use of fiber supplement such as Metamucil, Citrucel or Benefiber. 4.  Prescribed Amitiza 8 mcg twice daily with meals.  Did provide him with a week of samples prescribed #60 with a refill 5.  Prescribed Dicyclomine 10 mg to be used 20-30 minutes prior to dinner and at bedtime #60 with a refill 6.  Patient will follow with me in 4 weeks.  Again if no benefit from above would recommend further abdominal imaging with CT. 7. My nurse Katina Degree will call him in 2 weeks to check in.  Ellouise Newer, PA-C [facility] 08/05/2017, 3:49 PM  Cc: Biagio Borg, MD

## 2017-08-10 ENCOUNTER — Telehealth: Payer: Self-pay | Admitting: Physician Assistant

## 2017-08-10 NOTE — Telephone Encounter (Signed)
Prior authorization initiated on [email or web address] Patient aware that authorization may take up to 48 hours.

## 2017-08-11 MED ORDER — LINACLOTIDE 72 MCG PO CAPS: 72.0000 ug | ORAL_CAPSULE | Freq: Every day | ORAL | 1 refills | Status: DC

## 2017-08-11 NOTE — Telephone Encounter (Signed)
Can you tell patient that he can try Linzess 72 mcg daily and that his insurance did not cover Amitiza.  Thanks-JLL

## 2017-08-11 NOTE — Telephone Encounter (Signed)
Received denial from insurance company for [employer] please advise.

## 2017-08-11 NOTE — Telephone Encounter (Signed)
Left message on patients voicemail to call office.   

## 2017-08-11 NOTE — Telephone Encounter (Signed)
Spoke to patient and informed him of medication changes he verbalized understanding.

## 2017-08-12 ENCOUNTER — Telehealth: Payer: Self-pay | Admitting: Physician Assistant

## 2017-08-12 ENCOUNTER — Ambulatory Visit: Payer: 59 | Admitting: Gastroenterology

## 2017-08-12 NOTE — Telephone Encounter (Signed)
Pt wants to know the status of his medication. He said that his pharmacy told him that Linzess was rejected by his insurance as well.

## 2017-08-12 NOTE — Telephone Encounter (Signed)
Spoke to patient and informed him that PA for Linzess was approved. Called and spoke to pharmacist and informed them to fill prescription.

## 2017-08-19 ENCOUNTER — Telehealth: Payer: Self-pay

## 2017-08-19 NOTE — Telephone Encounter (Signed)
The pt states he has not been doing very well. Developed a fever of 100.5 did not take linzess due to illness. Has a head cold and will see PCP soon.  He will start linzess when he feels better and will call our office to report on symptoms in 2 weeks.

## 2017-08-19 NOTE — Telephone Encounter (Signed)
-----   Message from Jeoffrey Massed, RN sent at 08/05/2017  4:04 PM EDT -----   ----- Message ----- From: Lowell Guitar, RMA Sent: 08/05/2017   4:00 PM To: Jeoffrey Massed, RN  Per Anderson Malta, call patient in two weeks to check on him.

## 2017-09-10 ENCOUNTER — Encounter: Payer: Self-pay | Admitting: Physician Assistant

## 2017-09-10 ENCOUNTER — Ambulatory Visit: Payer: 59 | Admitting: Physician Assistant

## 2017-09-10 VITALS — BP 128/70 | HR 68 | Ht 66.0 in | Wt 165.0 lb

## 2017-09-10 DIAGNOSIS — R1031 Right lower quadrant pain: Secondary | ICD-10-CM

## 2017-09-10 DIAGNOSIS — K5900 Constipation, unspecified: Secondary | ICD-10-CM

## 2017-09-10 NOTE — Patient Instructions (Signed)
Try Miralax daily in the place of Linzess.   Please call the office with an update of your symptoms in the next one week and ask for Koren Shiver, RN, BSN.   You have been scheduled for a CT scan of the abdomen and pelvis at [facility] ([address]---this is in the same building as Press [facility]).   You are scheduled on 09/18/17 at 2:45 pm. You should arrive 15 minutes prior to your appointment time for registration. Please follow the written instructions below on the day of your exam:  WARNING: IF YOU ARE ALLERGIC TO IODINE/X-RAY DYE, PLEASE NOTIFY RADIOLOGY IMMEDIATELY AT [phone]! YOU WILL BE GIVEN A 13 HOUR PREMEDICATION PREP.  1) Do not eat anything after 10:45 am (4 hours prior to your test) 2) You have been given 2 bottles of oral contrast to drink. The solution may taste better if refrigerated, but do NOT add ice or any other liquid to this solution. Shake well before drinking.    Drink 1 bottle of contrast @ 12:45 pm (2 hours prior to your exam)  Drink 1 bottle of contrast @ 1:45 pm (1 hour prior to your exam)  You may take any medications as prescribed with a small amount of water except for the following: Metformin, Glucophage, Glucovance, Avandamet, Riomet, Fortamet, Actoplus Met, Janumet, Glumetza or Metaglip. The above medications must be held the day of the exam AND 48 hours after the exam.  The purpose of you drinking the oral contrast is to aid in the visualization of your intestinal tract. The contrast solution may cause some diarrhea. Before your exam is started, you will be given a small amount of fluid to drink. Depending on your individual set of symptoms, you may also receive an intravenous injection of x-ray contrast/dye. Plan on being at [facility] for 30 minutes or longer, depending on the type of exam you are having performed.  This test typically takes 30-45 minutes to complete.  If you have any questions regarding your exam or if  you need to reschedule, you may call the CT department at [phone] between the hours of 8:00 am and 5:00 pm, Monday-Friday.  ________________________________________________________________________

## 2017-09-10 NOTE — Progress Notes (Signed)
Chief Complaint: Follow-up right lower quadrant abdominal cramping and constipation  HPI:    Micheal Grant is a 52 year old male, who follows with Dr. Loletha Carrow, and returns to clinic today for follow-up of right lower quadrant abdominal cramping and constipation.    09/08/2016 colonoscopy revealed pandiverticulosis and a small cecal tubular adenoma.    08/05/2017 office visit described symptoms had gotten worse since the holidays with a poor diet, right lower quadrant cramping 8/10 that awaken him from sleep, only getting 4 hours of sleep.  Had tried hyoscyamine in the past with no help.  Also tried Linzess on and off which did not help much.  Described 1-2 incomplete bowel movements per the day.  At that time, discussed IBS in detail, given trial low FODMAP diet, recommend increasing fiber, started Amitiza 8 mcg twice daily, prescribed hyoscymaine prior to dinner and at bedtime, recommend follow-up in 4 weeks and to have CT if no improvement.    Today, explains that he is 30 to 40% better with the Linzess 72 mcg which he is using every other night.  Initially he tried this every morning but this would leave him going to the bathroom multiple times during work with a watery stool.  So he switched this to nightly dosing.  Explains that initially he tried the hyoscyamine but this made him very drowsy, even if he just took this the night before so he discontinued this.  Also made him somewhat more constipated and was prior to his use of Linzess.  (Amitiza not approved by insurance) Patient does describe using MiraLAX prior to getting his Linzess approved and explaining that this also helped.  When he takes his Linzess it does clear him out and he sleeps better that night and has less right lower quadrant cramping.    Denies fever, chills or blood in his stool.  Past Medical History:  Diagnosis Date  . ABDOMINAL PAIN RIGHT LOWER QUADRANT 08/27/2009  . ADD 07/08/2007  . ANXIETY 07/08/2007  . DEPRESSION  07/08/2007  . DIVERTICULOSIS, COLON 07/08/2007  . ERECTILE DYSFUNCTION 07/08/2007  . 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 FATIGUE 03/09/2008  . GLUCOSE INTOLERANCE 07/08/2007  . HYPERLIPIDEMIA 07/08/2007  . IBS 07/08/2007  . INSOMNIA-SLEEP DISORDER-UNSPEC 07/08/2007  . Rash and other nonspecific skin eruption 05/23/2008  . Throat pain 01/23/2010    Past Surgical History:  Procedure Laterality Date  . COLONOSCOPY    . none      Current Outpatient Medications  Medication Sig Dispense Refill  . Diclofenac Sodium (PENNSAID) 2 % SOLN Place 2 application onto the skin 2 (two) times daily. 112 g 3  . dicyclomine (BENTYL) 10 MG capsule Take 1 capsule (10 mg total) by mouth 2 (two) times daily. Take 20-30 minutes before dinner and at bedtime. 60 capsule 2  . hyoscyamine (LEVSIN, ANASPAZ) 0.125 MG tablet Take 0.125 mg by mouth as needed.    . linaclotide (LINZESS) 72 MCG capsule Take 1 capsule (72 mcg total) by mouth daily before breakfast. 30 capsule 1  . LORazepam (ATIVAN) 0.5 MG tablet Take 1 tablet (0.5 mg total) by mouth daily as needed for anxiety. 30 tablet 2  . zolpidem (AMBIEN) 10 MG tablet TAKE ONE TABLET BY MOUTH AT BEDTIME AS NEEDED 90 tablet 1   No current facility-administered medications for this visit.     Allergies as of 09/10/2017 - Review Complete 09/10/2017  Allergen Reaction Noted  . Bupropion hcl  03/09/2008  . Citalopram hydrobromide  03/09/2008  . Desvenlafaxine  03/09/2008  . Fluoxetine hcl  03/09/2008  . Venlafaxine  03/09/2008    Family History  Problem Relation Age of Onset  . Diabetes Mother   . Prostate cancer Father   . Heart disease Father        Bypass  . Depression Sister   . Colon cancer Neg Hx   . Colon polyps Neg Hx   . Kidney disease Neg Hx   . Liver disease Neg Hx   . Esophageal cancer Neg Hx   . Rectal cancer Neg Hx   . Stomach cancer Neg Hx     Social History   Socioeconomic History  . Marital status: Single    Spouse name: Not  on file  . Number of children: Not on file  . Years of education: Not on file  . Highest education level: Not on file  Occupational History  . Occupation: Primary school teacher: UNEMPLOYED  Social Needs  . Financial resource strain: Not on file  . Food insecurity:    Worry: Not on file    Inability: Not on file  . Transportation needs:    Medical: Not on file    Non-medical: Not on file  Tobacco Use  . Smoking status: Never Smoker  . Smokeless tobacco: Never Used  Substance and Sexual Activity  . Alcohol use: Yes    Comment: 1-2 drinks per week  . Drug use: No  . Sexual activity: Not on file  Lifestyle  . Physical activity:    Days per week: Not on file    Minutes per session: Not on file  . Stress: Not on file  Relationships  . Social connections:    Talks on phone: Not on file    Gets together: Not on file    Attends religious service: Not on file    Active member of club or organization: Not on file    Attends meetings of clubs or organizations: Not on file    Relationship status: Not on file  . Intimate partner violence:    Fear of current or ex partner: Not on file    Emotionally abused: Not on file    Physically abused: Not on file    Forced sexual activity: Not on file  Other Topics Concern  . Not on file  Social History Narrative  . Not on file    Review of Systems:    Constitutional: No weight loss, fever or chills Cardiovascular: No chest pain Respiratory: No SOB Gastrointestinal: See HPI and otherwise negative   Physical Exam:  Vital signs: BP 128/70 (BP Location: Left Arm, Patient Position: Sitting, Cuff Size: Normal)   Pulse 68   Ht 5\' 6"  (1.676 m)   Wt 165 lb (74.8 kg)   BMI 26.63 kg/m    Constitutional:   Pleasant male appears to be in NAD, Well developed, Well nourished, alert and cooperative Respiratory: Respirations even and unlabored. Lungs clear to auscultation bilaterally.   No wheezes, crackles, or rhonchi.  Cardiovascular:  Normal S1, S2. No MRG. Regular rate and rhythm. No peripheral edema, cyanosis or pallor.  Gastrointestinal:  Soft, nondistended, nontender. No rebound or guarding. Normal bowel sounds. No appreciable masses or hepatomegaly. Psychiatric: Demonstrates good judgement and reason without abnormal affect or behaviors.  No recent labs or imaging.  Assessment: 1.  Right lower quadrant abdominal cramping: Continues, worse at night, some better with relief of constipation 2.  Constipation: Some improvement on Linzess 72 mcg every other night, does  result in flushing of stool, but decreases cramping above; likely IBS vs mechanical obstruction/inflammation  Plan: 1.  Continues with symptoms which are only 30-40% better with Linzess every other night. 2.  Ordered CT abdomen/ pelvis with contrast for further evaluation. 3.  Discussed with patient that it sounds as the Linzess is too strong for him.  Would recommend we do a trial of MiraLAX daily instead.  Discussed that he can titrate this up to 4 times a day if necessary. 4.  If above does not help, can consider re-prescribing Amitiza 8 mcg twice daily, insurance may approve since he has failed Linzess. 5.  Patient to discontinue antispasmodics as these have side effects for him including drowsiness and worsening constipation. 6.  My nurse will call the patient in 2 weeks to see how he is doing. 7.  Patient to follow in clinic with me after results of CT above as recommended.  Ellouise Newer, PA-C [facility] 09/10/2017, 1:48 PM  Cc: Biagio Borg, MD

## 2017-09-11 NOTE — Progress Notes (Signed)
Thank you for sending this case to me. I have reviewed the entire note, and the outlined plan seems appropriate.  CT reasonable, though I have felt he has IBS-C.  Wilfrid Lund, MD

## 2017-09-18 ENCOUNTER — Ambulatory Visit (INDEPENDENT_AMBULATORY_CARE_PROVIDER_SITE_OTHER)
Admission: RE | Admit: 2017-09-18 | Discharge: 2017-09-18 | Disposition: A | Discharge status: Home / Self Care | Payer: 59 | Source: Ambulatory Visit | Attending: Physician Assistant | Admitting: Physician Assistant

## 2017-09-18 DIAGNOSIS — K5900 Constipation, unspecified: Secondary | ICD-10-CM

## 2017-09-18 DIAGNOSIS — K5730 Diverticulosis of large intestine without perforation or abscess without bleeding: Secondary | ICD-10-CM | POA: Diagnosis not present

## 2017-09-18 MED ORDER — IOPAMIDOL (ISOVUE-300) INJECTION 61%
100.0000 mL | Freq: Once | INTRAVENOUS | Status: AC | PRN
  Administered 2017-09-18: 100 mL via INTRAVENOUS

## 2017-09-24 ENCOUNTER — Telehealth: Payer: Self-pay | Admitting: Physician Assistant

## 2017-09-24 NOTE — Telephone Encounter (Signed)
Spoke with pt and he is aware of results:  Notes recorded by Levin Erp, PA on 09/22/2017 at 8:48 AM EDT Please let patient know Ct was normal. Please schedule him for follow up with Dr. Loletha Carrow in 2-3 months. Please ask how Miralax is doing for him. Thanks-JLL  Pt states the miralax has helped, just needs to find a happy medium between the miralax and linzess. Pt scheduled to see Dr. Loletha Carrow 11/24/17@10 :45am. Pt aware of appt.

## 2017-11-24 ENCOUNTER — Ambulatory Visit: Payer: 59 | Admitting: Gastroenterology

## 2017-11-24 ENCOUNTER — Encounter: Payer: Self-pay | Admitting: Gastroenterology

## 2017-11-24 VITALS — BP 110/80 | HR 72 | Ht 66.0 in | Wt 168.0 lb

## 2017-11-24 DIAGNOSIS — F5101 Primary insomnia: Secondary | ICD-10-CM

## 2017-11-24 DIAGNOSIS — R140 Abdominal distension (gaseous): Secondary | ICD-10-CM

## 2017-11-24 DIAGNOSIS — K5909 Other constipation: Secondary | ICD-10-CM

## 2017-11-24 MED ORDER — TRAZODONE HCL 50 MG PO TABS: 25.0000 mg | ORAL_TABLET | Freq: Every day | ORAL | 0 refills | Status: DC

## 2017-11-24 NOTE — Patient Instructions (Signed)
If you are age 54 or older, your body mass index should be between 23-30. Your Body mass index is 27.12 kg/m. If this is out of the aforementioned range listed, please consider follow up with your Primary Care Provider.  If you are age 78 or younger, your body mass index should be between 19-25. Your Body mass index is 27.12 kg/m. If this is out of the aformentioned range listed, please consider follow up with your Primary Care Provider.   It was a pleasure to see you today!  Dr. Loletha Carrow

## 2017-11-24 NOTE — Progress Notes (Signed)
     [facility] Progress Note  Chief Complaint: Right lower quadrant pain  Subjective  History:  Micheal Grant is generally doing well, and his constipation has improved to the point that he now only needs occasional MiraLAX.  He still has abdominal bloating seems to bother him more in the evening and he feels it is keeping him from sleeping well.  He has taken occasional Ambien from primary care, and while this might help him get to sleep, and only lasts a few hours.  He also has has to urinate a few times each night. His appetite is been good and weight stable, denies rectal bleeding.  Bentyl did not seem to help his abdominal bloating or pain, so he no longer takes it.  ROS: Cardiovascular:  no chest pain Respiratory: no dyspnea Insomnia as noted above  remainder of systems negative except as above  The patient's Past Medical, Family and Social History were reviewed and are on file in the EMR.  Objective:  Med list reviewed  Current Outpatient Medications:  .  Diclofenac Sodium (PENNSAID) 2 % SOLN, Place 2 application onto the skin 2 (two) times daily., Disp: 112 g, Rfl: 3 .  dicyclomine (BENTYL) 10 MG capsule, Take 1 capsule (10 mg total) by mouth 2 (two) times daily. Take 20-30 minutes before dinner and at bedtime., Disp: 60 capsule, Rfl: 2 .  hyoscyamine (LEVSIN, ANASPAZ) 0.125 MG tablet, Take 0.125 mg by mouth as needed., Disp: , Rfl:  .  linaclotide (LINZESS) 72 MCG capsule, Take 1 capsule (72 mcg total) by mouth daily before breakfast., Disp: 30 capsule, Rfl: 1 .  LORazepam (ATIVAN) 0.5 MG tablet, Take 1 tablet (0.5 mg total) by mouth daily as needed for anxiety., Disp: 30 tablet, Rfl: 2 .  zolpidem (AMBIEN) 10 MG tablet, TAKE ONE TABLET BY MOUTH AT BEDTIME AS NEEDED, Disp: 90 tablet, Rfl: 1 .  traZODone (DESYREL) 50 MG tablet, Take 0.5 tablets (25 mg total) by mouth at bedtime., Disp: 15 tablet, Rfl: 0   Vital signs in last 24 hrs: Vitals:   11/24/17 1045  BP: 110/80    Pulse: 72    Physical Exam  He is well-appearing  HEENT: sclera anicteric, oral mucosa moist without lesions  Neck: supple, no thyromegaly, JVD or lymphadenopathy  Cardiac: RRR without murmurs, S1S2 heard, no peripheral edema  Pulm: clear to auscultation bilaterally, normal RR and effort noted  Abdomen: soft, no tenderness, with active bowel sounds. No guarding or palpable hepatosplenomegaly.  Skin; warm and dry, no jaundice or rash Radiologic studies:  CTAP 09/19/17 - aortic atherosclerosis , o/w normal.  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  @ASSESSMENTPLANBEGIN @ Assessment: Encounter Diagnoses  Name Primary?  . Chronic constipation Yes  . Abdominal bloating   . Primary insomnia    He seems to have functional abdominal bloating.  We discussed our current understanding of this condition and limited available therapies.  I recommended some peppermint or chamomile essential oil.  I do not know how much is contributing to his insomnia which sounds multifactorial.  I gave him a brief trial of trazodone 50 mg tablets, half tablet at bedtime.  If helpful, he will seek follow-up with primary care for maintenance of that.  I will otherwise see him as needed.   Total time 25 minutes, over half spent face-to-face with patient in counseling and coordination of care.   Micheal Grant

## 2017-12-02 ENCOUNTER — Encounter: Payer: Self-pay | Admitting: Internal Medicine

## 2017-12-03 MED ORDER — LORAZEPAM 0.5 MG PO TABS: 0.5000 mg | ORAL_TABLET | Freq: Every day | ORAL | 0 refills | Status: DC | PRN

## 2017-12-03 MED ORDER — ZOLPIDEM TARTRATE 10 MG PO TABS: ORAL_TABLET | ORAL | 0 refills | Status: DC

## 2018-03-22 ENCOUNTER — Ambulatory Visit (INDEPENDENT_AMBULATORY_CARE_PROVIDER_SITE_OTHER): Payer: 59

## 2018-03-22 DIAGNOSIS — Z23 Encounter for immunization: Secondary | ICD-10-CM | POA: Diagnosis not present

## 2018-11-05 ENCOUNTER — Encounter: Payer: Self-pay | Admitting: Internal Medicine

## 2018-11-05 DIAGNOSIS — Z23 Encounter for immunization: Secondary | ICD-10-CM

## 2018-12-14 ENCOUNTER — Ambulatory Visit (INDEPENDENT_AMBULATORY_CARE_PROVIDER_SITE_OTHER): Payer: 59

## 2018-12-14 ENCOUNTER — Ambulatory Visit (INDEPENDENT_AMBULATORY_CARE_PROVIDER_SITE_OTHER): Payer: 59 | Admitting: Sports Medicine

## 2018-12-14 ENCOUNTER — Encounter: Payer: Self-pay | Admitting: Sports Medicine

## 2018-12-14 ENCOUNTER — Other Ambulatory Visit: Payer: Self-pay

## 2018-12-14 ENCOUNTER — Other Ambulatory Visit: Payer: Self-pay | Admitting: Sports Medicine

## 2018-12-14 VITALS — BP 113/67 | Temp 98.1°F

## 2018-12-14 DIAGNOSIS — M79675 Pain in left toe(s): Secondary | ICD-10-CM

## 2018-12-14 DIAGNOSIS — S92502A Displaced unspecified fracture of left lesser toe(s), initial encounter for closed fracture: Secondary | ICD-10-CM | POA: Diagnosis not present

## 2018-12-14 DIAGNOSIS — S93509A Unspecified sprain of unspecified toe(s), initial encounter: Secondary | ICD-10-CM | POA: Diagnosis not present

## 2018-12-14 NOTE — Progress Notes (Signed)
Subjective: Micheal Grant is a 53 y.o. male patient who presents to office for evaluation of Left foot pain. Patient complains of progressive pain since last night  Admits that he bumped the 5th toe on a door last night. Patient has tried icing with no relief in symptoms, admits that there is some pain but its not bad. Patient denies any other pedal complaints.   Review of Systems  All other systems reviewed and are negative.    Patient Active Problem List   Diagnosis Date Noted  . Chest pain 02/04/2017  . 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 Bursitis of left hip 12/28/2015  . Bradycardia 12/14/2013  . Internal hemorrhoids with other complication 14/43/1540  . Hematochezia 02/02/2013  . Increased prostate specific antigen (PSA) velocity 08/23/2012  . Left shoulder pain 08/23/2012  . Anal itch 02/02/2012  . Encounter for well adult exam with abnormal findings 07/25/2010  . FATIGUE 03/09/2008  . Impaired glucose tolerance 07/08/2007  . HYPERLIPIDEMIA 07/08/2007  . Anxiety state 07/08/2007  . ERECTILE DYSFUNCTION 07/08/2007  . Depression 07/08/2007  . ADD 07/08/2007  . Diverticulosis of colon 07/08/2007  . IBS 07/08/2007  . INSOMNIA-SLEEP DISORDER-UNSPEC 07/08/2007    Current Outpatient Medications on File Prior to Visit  Medication Sig Dispense Refill  . Diclofenac Sodium (PENNSAID) 2 % SOLN Place 2 application onto the skin 2 (two) times daily. 112 g 3  . dicyclomine (BENTYL) 10 MG capsule Take 1 capsule (10 mg total) by mouth 2 (two) times daily. Take 20-30 minutes before dinner and at bedtime. 60 capsule 2  . hyoscyamine (LEVSIN, ANASPAZ) 0.125 MG tablet Take 0.125 mg by mouth as needed.    . linaclotide (LINZESS) 72 MCG capsule Take 1 capsule (72 mcg total) by mouth daily before breakfast. 30 capsule 1  . LORazepam (ATIVAN) 0.5 MG tablet Take 1 tablet (0.5 mg total) by mouth daily as needed for anxiety. Please make return office visit for further refills 90 tablet 0   . traZODone (DESYREL) 50 MG tablet Take 0.5 tablets (25 mg total) by mouth at bedtime. 15 tablet 0  . zolpidem (AMBIEN) 10 MG tablet TAKE ONE TABLET BY MOUTH AT BEDTIME AS NEEDED 90 tablet 0   No current facility-administered medications on file prior to visit.     Allergies  Allergen Reactions  . Bupropion Hcl     REACTION: agitation  . Citalopram Hydrobromide     REACTION: agitation  . Desvenlafaxine     REACTION: agitation  . Fluoxetine Hcl     REACTION: agitation  . Venlafaxine     REACTION: agitation    Objective:  General: Alert and oriented x3 in no acute distress  Dermatology: No open lesions bilateral lower extremities, no webspace macerations, + ecchymosis left foot, all nails x 10 are well manicured.  Vascular: Focal edema noted to left foot. Dorsalis Pedis and Posterior Tibial pedal pulses 2/4, Capillary Fill Time 3 seconds,(+) pedal hair growth bilateral, Temperature gradient within normal limits.  Neurology: Gross sensation intact via light touch bilateral.    Musculoskeletal: There is tenderness with palpation at Left 5th toe. All joint range of motion is within normal limits, Strength within normal limits in all groups bilateral.   Gait: Unassisted, Antalgic gait  Xrays  Left Foot   Impression: Incomplete fracture proximal phalanx of 5th toe with mild soft tissue swelling. No other acute findings.    Assessment and Plan: Problem List Items Addressed This Visit    None  Visit Diagnoses    Pain of toe of left foot    -  Primary   Relevant Orders   DG Foot Complete Left   Closed fracture of phalanx of left fifth toe, initial encounter       Toe sprain, initial encounter          -Complete examination performed -Xrays reviewed -Discussed treatement options for fracture; risks, alternatives, and benefits explained. -Applied Left 5th toe splint  -Dispensed post op shoe for patient to wear at all times and instructed on use -Recommend protection,  rest, ice, elevation daily until symptoms improve -May use OTC  pain med/antinflammatories as needed -Patient to return to office in 4 weeks for serial x-rays to assess healing  or sooner if condition worsens.  Landis Martins, DPM

## 2019-01-11 ENCOUNTER — Ambulatory Visit: Payer: 59 | Admitting: Sports Medicine

## 2019-01-11 ENCOUNTER — Ambulatory Visit (INDEPENDENT_AMBULATORY_CARE_PROVIDER_SITE_OTHER): Payer: 59

## 2019-01-11 ENCOUNTER — Other Ambulatory Visit: Payer: Self-pay | Admitting: Sports Medicine

## 2019-01-11 ENCOUNTER — Encounter: Payer: Self-pay | Admitting: Sports Medicine

## 2019-01-11 ENCOUNTER — Other Ambulatory Visit: Payer: Self-pay

## 2019-01-11 DIAGNOSIS — S92502D Displaced unspecified fracture of left lesser toe(s), subsequent encounter for fracture with routine healing: Secondary | ICD-10-CM

## 2019-01-11 DIAGNOSIS — S92502A Displaced unspecified fracture of left lesser toe(s), initial encounter for closed fracture: Secondary | ICD-10-CM

## 2019-01-11 DIAGNOSIS — M79675 Pain in left toe(s): Secondary | ICD-10-CM

## 2019-01-11 DIAGNOSIS — S93509D Unspecified sprain of unspecified toe(s), subsequent encounter: Secondary | ICD-10-CM

## 2019-01-11 NOTE — Progress Notes (Signed)
Subjective: Micheal Grant is a 53 y.o. male patient who presents to office for follow up evaluation for fracture at left 5th toe. Reports that he is doing good.  No pain to the toe.  Reports that he has been taping and splinting the toes without any pain or discomfort.  Patient denies any redness swelling drainage warmth or any acute symptoms.  Patient denies any other pedal complaints.   Patient Active Problem List   Diagnosis Date Noted  . Chest pain 02/04/2017  . 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 Bursitis of left hip 12/28/2015  . Bradycardia 12/14/2013  . Internal hemorrhoids with other complication [id number]  . Hematochezia 02/02/2013  . Increased prostate specific antigen (PSA) velocity 08/23/2012  . Left shoulder pain 08/23/2012  . Anal itch 02/02/2012  . Encounter for well adult exam with abnormal findings 07/25/2010  . FATIGUE 03/09/2008  . Impaired glucose tolerance 07/08/2007  . HYPERLIPIDEMIA 07/08/2007  . Anxiety state 07/08/2007  . ERECTILE DYSFUNCTION 07/08/2007  . Depression 07/08/2007  . ADD 07/08/2007  . Diverticulosis of colon 07/08/2007  . IBS 07/08/2007  . INSOMNIA-SLEEP DISORDER-UNSPEC 07/08/2007    Current Outpatient Medications on File Prior to Visit  Medication Sig Dispense Refill  . Diclofenac Sodium (PENNSAID) 2 % SOLN Place 2 application onto the skin 2 (two) times daily. 112 g 3  . dicyclomine (BENTYL) 10 MG capsule Take 1 capsule (10 mg total) by mouth 2 (two) times daily. Take 20-30 minutes before dinner and at bedtime. 60 capsule 2  . hyoscyamine (LEVSIN, ANASPAZ) 0.125 MG tablet Take 0.125 mg by mouth as needed.    . linaclotide (LINZESS) 72 MCG capsule Take 1 capsule (72 mcg total) by mouth daily before breakfast. 30 capsule 1  . LORazepam (ATIVAN) 0.5 MG tablet Take 1 tablet (0.5 mg total) by mouth daily as needed for anxiety. Please make return office visit for further refills 90 tablet 0  . traZODone (DESYREL) 50 MG tablet Take 0.5  tablets (25 mg total) by mouth at bedtime. 15 tablet 0  . zolpidem (AMBIEN) 10 MG tablet TAKE ONE TABLET BY MOUTH AT BEDTIME AS NEEDED 90 tablet 0   No current facility-administered medications on file prior to visit.     Allergies  Allergen Reactions  . Bupropion Hcl     REACTION: agitation  . Citalopram Hydrobromide     REACTION: agitation  . Desvenlafaxine     REACTION: agitation  . Fluoxetine Hcl     REACTION: agitation  . Venlafaxine     REACTION: agitation    Objective:  General: Alert and oriented x3 in no acute distress  Dermatology: No open lesions bilateral lower extremities, no webspace macerations, Resolved ecchymosis left foot, all nails x 10 are well manicured.  Vascular: Focal edema noted to left foot. Dorsalis Pedis and Posterior Tibial pedal pulses 2/4, Capillary Fill Time 3 seconds,(+) pedal hair growth bilateral, Temperature gradient within normal limits.  Neurology: Gross sensation intact via light touch bilateral.    Musculoskeletal: There is no residual tenderness with palpation at Left 5th toe. All joint range of motion is within normal limits, Strength within normal limits in all groups bilateral.   Xrays  Left Foot   Impression: Incomplete fracture proximal phalanx of 5th toe with mild soft tissue swelling, 75% improved. No other acute findings.    Assessment and Plan: Problem List Items Addressed This Visit    None    Visit Diagnoses    Closed fracture of phalanx of  left fifth toe with routine healing, subsequent encounter    -  Primary   Relevant Orders   DG Foot Complete Left   Pain of toe of left foot       Sprain of toe, subsequent encounter          -Complete examination performed -Xrays reviewed -Re-Discussed treatement options for fracture; risks, alternatives, and benefits explained. -Continue with post op shoe for 2 more weeks and then may slowly transition to normal tennis shoe -Recommend protection, rest, ice, elevation daily  until symptoms improve -May use OTC  pain med/antinflammatories as needed -Patient to return to office in 4 weeks for final serial x-rays to assess healing  or sooner if condition worsens.  Landis Martins, DPM

## 2019-02-01 ENCOUNTER — Other Ambulatory Visit: Payer: Self-pay

## 2019-02-01 DIAGNOSIS — Z20822 Contact with and (suspected) exposure to covid-19: Secondary | ICD-10-CM

## 2019-02-03 LAB — NOVEL CORONAVIRUS, NAA: SARS-CoV-2, NAA: NOT DETECTED

## 2019-02-08 ENCOUNTER — Encounter: Payer: Self-pay | Admitting: Sports Medicine

## 2019-02-08 ENCOUNTER — Ambulatory Visit (INDEPENDENT_AMBULATORY_CARE_PROVIDER_SITE_OTHER): Payer: 59 | Admitting: Sports Medicine

## 2019-02-08 ENCOUNTER — Ambulatory Visit (INDEPENDENT_AMBULATORY_CARE_PROVIDER_SITE_OTHER): Payer: 59

## 2019-02-08 ENCOUNTER — Other Ambulatory Visit: Payer: Self-pay

## 2019-02-08 DIAGNOSIS — S93509D Unspecified sprain of unspecified toe(s), subsequent encounter: Secondary | ICD-10-CM | POA: Diagnosis not present

## 2019-02-08 DIAGNOSIS — M79672 Pain in left foot: Secondary | ICD-10-CM

## 2019-02-08 DIAGNOSIS — M79675 Pain in left toe(s): Secondary | ICD-10-CM | POA: Diagnosis not present

## 2019-02-08 DIAGNOSIS — S92502D Displaced unspecified fracture of left lesser toe(s), subsequent encounter for fracture with routine healing: Secondary | ICD-10-CM

## 2019-02-08 NOTE — Progress Notes (Signed)
Subjective: Micheal Grant is a 53 y.o. male patient who presents to office for follow up evaluation for fracture at left 5th toe. Reports that he is doing good.  No pain to the toe.  Reports some swelling.  Patient denies any redness drainage warmth or any acute symptoms.  Patient denies any other pedal complaints.   Review of Systems  All other systems reviewed and are negative.    Patient Active Problem List   Diagnosis Date Noted  . Chest pain 02/04/2017  . 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 Bursitis of left hip 12/28/2015  . Bradycardia 12/14/2013  . Internal hemorrhoids with other complication [id number]  . Hematochezia 02/02/2013  . Increased prostate specific antigen (PSA) velocity 08/23/2012  . Left shoulder pain 08/23/2012  . Anal itch 02/02/2012  . Encounter for well adult exam with abnormal findings 07/25/2010  . FATIGUE 03/09/2008  . Impaired glucose tolerance 07/08/2007  . HYPERLIPIDEMIA 07/08/2007  . Anxiety state 07/08/2007  . ERECTILE DYSFUNCTION 07/08/2007  . Depression 07/08/2007  . ADD 07/08/2007  . Diverticulosis of colon 07/08/2007  . IBS 07/08/2007  . INSOMNIA-SLEEP DISORDER-UNSPEC 07/08/2007    Current Outpatient Medications on File Prior to Visit  Medication Sig Dispense Refill  . Diclofenac Sodium (PENNSAID) 2 % SOLN Place 2 application onto the skin 2 (two) times daily. 112 g 3  . dicyclomine (BENTYL) 10 MG capsule Take 1 capsule (10 mg total) by mouth 2 (two) times daily. Take 20-30 minutes before dinner and at bedtime. 60 capsule 2  . hyoscyamine (LEVSIN, ANASPAZ) 0.125 MG tablet Take 0.125 mg by mouth as needed.    . linaclotide (LINZESS) 72 MCG capsule Take 1 capsule (72 mcg total) by mouth daily before breakfast. 30 capsule 1  . LORazepam (ATIVAN) 0.5 MG tablet Take 1 tablet (0.5 mg total) by mouth daily as needed for anxiety. Please make return office visit for further refills 90 tablet 0  . traZODone (DESYREL) 50 MG tablet Take 0.5  tablets (25 mg total) by mouth at bedtime. 15 tablet 0  . zolpidem (AMBIEN) 10 MG tablet TAKE ONE TABLET BY MOUTH AT BEDTIME AS NEEDED 90 tablet 0   No current facility-administered medications on file prior to visit.     Allergies  Allergen Reactions  . Bupropion Hcl     REACTION: agitation  . Citalopram Hydrobromide     REACTION: agitation  . Desvenlafaxine     REACTION: agitation  . Fluoxetine Hcl     REACTION: agitation  . Venlafaxine     REACTION: agitation    Objective:  General: Alert and oriented x3 in no acute distress  Dermatology: No open lesions bilateral lower extremities, no webspace macerations, Resolved ecchymosis left foot, all nails x 10 are well manicured.  Vascular: Focal edema noted to left foot. Dorsalis Pedis and Posterior Tibial pedal pulses 2/4, Capillary Fill Time 3 seconds,(+) pedal hair growth bilateral, Temperature gradient within normal limits.  Neurology: Gross sensation intact via light touch bilateral.    Musculoskeletal: There is no residual tenderness with palpation at Left 5th toe. All joint range of motion is within normal limits, Strength within normal limits in all groups bilateral.   Xrays  Left Foot   Impression: Incomplete fracture proximal phalanx of 5th toe with mild soft tissue swelling, 90% improved. No other acute findings.    Assessment and Plan: Problem List Items Addressed This Visit    None    Visit Diagnoses    Closed fracture of phalanx  of left fifth toe with routine healing, subsequent encounter    -  Primary   Relevant Orders   DG Foot Complete Left (Completed)   Pain in left foot       Pain of toe of left foot       Sprain of toe, subsequent encounter          -Complete examination performed -Xrays reviewed -Re-Discussed treatement options for fracture; risks, alternatives, and benefits explained. -Fracture is healing may transition to normal shoe and may increase activities to tolerance -Patient to return to  office as needed or sooner if condition worsens.  Landis Martins, DPM

## 2019-07-08 ENCOUNTER — Other Ambulatory Visit: Payer: Self-pay | Admitting: Internal Medicine

## 2019-07-08 NOTE — Telephone Encounter (Addendum)
Very sorry, unable to refill either of these meds without OV due to office refill policy  Please consider ROV

## 2019-07-08 NOTE — Telephone Encounter (Signed)
Patient has an appointment on 07/11/2019

## 2019-07-08 NOTE — Telephone Encounter (Signed)
New Message:   1.Medication Requested: LORazepam (ATIVAN) 0.5 MG tablet zolpidem (AMBIEN) 10 MG tablet 2. Pharmacy (Name, Street, [place]): [place], [place] - [id number] 3. On Med List: yes  4. Last Visit with PCP: 02/04/17  5. Next visit date with PCP: 07/11/19   Agent: Please be advised that RX refills may take up to 3 business days. We ask that you follow-up with your pharmacy.

## 2019-07-11 ENCOUNTER — Other Ambulatory Visit: Payer: Self-pay

## 2019-07-11 ENCOUNTER — Encounter: Payer: Self-pay | Admitting: Internal Medicine

## 2019-07-11 ENCOUNTER — Ambulatory Visit (INDEPENDENT_AMBULATORY_CARE_PROVIDER_SITE_OTHER): Payer: 59 | Admitting: Internal Medicine

## 2019-07-11 VITALS — BP 124/82 | HR 50 | Temp 97.8°F | Ht 66.0 in | Wt 167.0 lb

## 2019-07-11 DIAGNOSIS — E611 Iron deficiency: Secondary | ICD-10-CM

## 2019-07-11 DIAGNOSIS — M545 Low back pain, unspecified: Secondary | ICD-10-CM | POA: Insufficient documentation

## 2019-07-11 DIAGNOSIS — E785 Hyperlipidemia, unspecified: Secondary | ICD-10-CM

## 2019-07-11 DIAGNOSIS — M79641 Pain in right hand: Secondary | ICD-10-CM

## 2019-07-11 DIAGNOSIS — E538 Deficiency of other specified B group vitamins: Secondary | ICD-10-CM | POA: Diagnosis not present

## 2019-07-11 DIAGNOSIS — E559 Vitamin D deficiency, unspecified: Secondary | ICD-10-CM

## 2019-07-11 DIAGNOSIS — R7302 Impaired glucose tolerance (oral): Secondary | ICD-10-CM

## 2019-07-11 DIAGNOSIS — Z0001 Encounter for general adult medical examination with abnormal findings: Secondary | ICD-10-CM | POA: Diagnosis not present

## 2019-07-11 DIAGNOSIS — M79642 Pain in left hand: Secondary | ICD-10-CM

## 2019-07-11 DIAGNOSIS — Z23 Encounter for immunization: Secondary | ICD-10-CM

## 2019-07-11 MED ORDER — ZOLPIDEM TARTRATE 10 MG PO TABS: ORAL_TABLET | ORAL | 1 refills | Status: DC

## 2019-07-11 MED ORDER — LORAZEPAM 0.5 MG PO TABS: 0.5000 mg | ORAL_TABLET | Freq: Every day | ORAL | 1 refills | Status: DC | PRN

## 2019-07-11 MED ORDER — CYCLOBENZAPRINE HCL 5 MG PO TABS: 5.0000 mg | ORAL_TABLET | Freq: Three times a day (TID) | ORAL | 1 refills | Status: DC | PRN

## 2019-07-11 NOTE — Progress Notes (Signed)
Subjective:    Patient ID: Micheal Grant, male    DOB: [birth year], 54 y.o.   MRN: [id number]  HPI  Here for wellness and f/u;  Overall doing ok;  Pt denies Chest pain, worsening SOB, DOE, wheezing, orthopnea, PND, worsening LE edema, palpitations, dizziness or syncope.  Pt denies neurological change such as new headache, facial or extremity weakness.  Pt denies polydipsia, polyuria, or low sugar symptoms. Pt states overall good compliance with treatment and medications, good tolerability, and has been trying to follow appropriate diet.  Pt denies worsening depressive symptoms, suicidal ideation or panic. No fever, night sweats, wt loss, loss of appetite, or other constitutional symptoms.  Pt states good ability with ADL's, has low fall risk, home safety reviewed and adequate, no other significant changes in hearing or vision, and only occasionally active with exercise. 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 BP Readings from Last 3 Encounters:  07/11/19 124/82  12/14/18 113/67  11/24/17 110/80   Wt Readings from Last 3 Encounters:  07/11/19 167 lb (75.8 kg)  11/24/17 168 lb (76.2 kg)  09/10/17 165 lb (74.8 kg)   Past Medical History:  Diagnosis Date  . ABDOMINAL PAIN RIGHT LOWER QUADRANT 08/27/2009  . ADD 07/08/2007  . ANXIETY 07/08/2007  . DEPRESSION 07/08/2007  . DIVERTICULOSIS, COLON 07/08/2007  . ERECTILE DYSFUNCTION 07/08/2007  . 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 FATIGUE 03/09/2008  . GLUCOSE INTOLERANCE 07/08/2007  . HYPERLIPIDEMIA 07/08/2007  . IBS 07/08/2007  . INSOMNIA-SLEEP DISORDER-UNSPEC 07/08/2007  . Rash and other nonspecific skin eruption 05/23/2008  . Throat pain 01/23/2010   Past Surgical History:  Procedure Laterality  Date  . COLONOSCOPY    . none      reports that he has never smoked. He has never used smokeless tobacco. He reports current alcohol use. He reports that he does not use drugs. family history includes Depression in his sister; Diabetes in his mother; Heart disease in his father; Prostate cancer in his father. Allergies  Allergen Reactions  . Bupropion Hcl     REACTION: agitation  . Citalopram Hydrobromide     REACTION: agitation  . Desvenlafaxine     REACTION: agitation  . Fluoxetine Hcl     REACTION: agitation  . Venlafaxine     REACTION: agitation   Current Outpatient Medications on File Prior to Visit  Medication Sig Dispense Refill  . Diclofenac Sodium (PENNSAID) 2 % SOLN Place 2 application onto the skin 2 (two) times daily. (Patient not taking: Reported on 07/11/2019) 112 g 3  . dicyclomine (BENTYL) 10 MG capsule Take 1 capsule (10 mg total) by mouth 2 (two) times daily. Take 20-30 minutes before dinner and at bedtime. (Patient not taking: Reported on 07/11/2019) 60 capsule 2  . hyoscyamine (LEVSIN, ANASPAZ) 0.125 MG tablet Take 0.125 mg by mouth as needed.    . linaclotide (LINZESS) 72 MCG capsule Take 1 capsule (72 mcg total) by mouth daily before breakfast. (Patient not taking: Reported on 07/11/2019) 30 capsule 1  . traZODone (DESYREL) 50 MG tablet Take 0.5 tablets (25 mg total) by mouth at bedtime. (Patient not taking: Reported on 07/11/2019) 15 tablet 0   No current facility-administered medications on file prior to visit.   Review of  Systems All otherwise neg per pt     Objective:   Physical Exam BP 124/82   Pulse (!) 50   Temp 97.8 F (36.6 C)   Ht 5\' 6"  (1.676 m)   Wt 167 lb (75.8 kg)   SpO2 100%   BMI 26.95 kg/m  VS noted,  Constitutional: Pt appears in NAD HENT: Head: NCAT.  Right Ear: External ear normal.  Left Ear: External ear normal.  Eyes: . Pupils are equal, round, and reactive to light. Conjunctivae and EOM are normal Nose: without d/c or  deformity Neck: Neck supple. Gross normal ROM Cardiovascular: Normal rate and regular rhythm.   Pulmonary/Chest: Effort normal and breath sounds without rales or wheezing.  Abd:  Soft, NT, ND, + BS, no organomegaly Neurological: Pt is alert. At baseline orientation, motor grossly intact Skin: Skin is warm. No rashes, other new lesions, no LE edema Psychiatric: Pt behavior is normal without agitation  All otherwise neg per pt  Lab Results  Component Value Date   WBC 4.8 02/04/2017   HGB 15.5 02/04/2017   HCT 46.3 02/04/2017   PLT 240.0 02/04/2017   GLUCOSE 102 (H) 02/04/2017   CHOL 206 (H) 02/04/2017   TRIG 133.0 02/04/2017   HDL 54.40 02/04/2017   LDLDIRECT 130.5 07/25/2010   LDLCALC 125 (H) 02/04/2017   ALT 33 02/04/2017   AST 26 02/04/2017   NA 139 02/04/2017   K 4.6 02/04/2017   CL 103 02/04/2017   CREATININE 1.07 02/04/2017   BUN 11 02/04/2017   CO2 30 02/04/2017   TSH 2.94 02/04/2017   PSA 1.92 02/04/2017   HGBA1C 6.0 12/15/2013      Assessment & Plan:

## 2019-07-11 NOTE — Assessment & Plan Note (Addendum)
Cant r/o RA - for lab and films,  to f/u any worsening symptoms or concerns  I spent 32 minutes in addition to time for CPX wellness examination in preparing to see the patient by review of recent labs, imaging and procedures, obtaining and reviewing separately obtained history, communicating with the patient and family or caregiver, ordering medications, tests or procedures, and documenting clinical information in the EHR including the differential Dx, treatment, and any further evaluation and other management of bilateral hand pain, lbp, HLD, hyperglycemia

## 2019-07-11 NOTE — Assessment & Plan Note (Signed)
stable overall by history and exam, recent data reviewed with pt, and pt to continue medical treatment as before,  to f/u any worsening symptoms or concerns  

## 2019-07-11 NOTE — Assessment & Plan Note (Signed)
For lower chol diet, f/u lab 

## 2019-07-11 NOTE — Patient Instructions (Signed)
Please take all new medication as prescribed - the muscle relaxer as needed  Please continue all other medications as before, and refills have been done if requested.  Please have the pharmacy call with any other refills you may need.  Please continue your efforts at being more active, low cholesterol diet, and weight control.  You are otherwise up to date with prevention measures today.  Please keep your appointments with your specialists as you may have planned  Please go to the LAB at the blood drawing area for the tests to be done tomorrow  Please go to the XRAY Department in the first floor for the x-ray testing  You will be contacted by phone if any changes need to be made immediately.  Otherwise, you will receive a letter about your results with an explanation, but please check with MyChart first.  Please remember to sign up for MyChart if you have not done so, as this will be important to you in the future with finding out test results, communicating by private email, and scheduling acute appointments online when needed.  Please make an Appointment to return for your 1 year visit, or sooner if needed

## 2019-07-11 NOTE — Assessment & Plan Note (Signed)
C/w msk strain, cant ro underlying lumbar djd ddd -for films

## 2019-07-11 NOTE — Assessment & Plan Note (Signed)

## 2019-07-12 LAB — URINALYSIS, ROUTINE W REFLEX MICROSCOPIC
Bilirubin Urine: NEGATIVE
Hgb urine dipstick: NEGATIVE
Ketones, ur: NEGATIVE
Leukocytes,Ua: NEGATIVE
Nitrite: NEGATIVE
RBC / HPF: NONE SEEN (ref 0–?)
Specific Gravity, Urine: 1.02 (ref 1.000–1.030)
Total Protein, Urine: NEGATIVE
Urine Glucose: NEGATIVE
Urobilinogen, UA: 0.2 (ref 0.0–1.0)
pH: 6 (ref 5.0–8.0)

## 2019-07-12 LAB — LIPID PANEL
Cholesterol: 212 mg/dL — ABNORMAL HIGH (ref 0–200)
HDL: 46.5 mg/dL (ref >39.00)
LDL Cholesterol: 133 mg/dL — ABNORMAL HIGH (ref 0–99)
NonHDL: 165.47
Total CHOL/HDL Ratio: 5
Triglycerides: 162 mg/dL — ABNORMAL HIGH (ref 0.0–149.0)
VLDL: 32.4 mg/dL (ref 0.0–40.0)

## 2019-07-12 LAB — BASIC METABOLIC PANEL
BUN: 13 mg/dL (ref 6–23)
CO2: 29 mEq/L (ref 19–32)
Calcium: 10.3 mg/dL (ref 8.4–10.5)
Chloride: 103 mEq/L (ref 96–112)
Creatinine, Ser: 1.08 mg/dL (ref 0.40–1.50)
GFR: 70.76 mL/min (ref >60.00)
Glucose, Bld: 102 mg/dL — ABNORMAL HIGH (ref 70–99)
Potassium: 5.2 mEq/L — ABNORMAL HIGH (ref 3.5–5.1)
Sodium: 140 mEq/L (ref 135–145)

## 2019-07-12 LAB — CBC WITH DIFFERENTIAL/PLATELET
Basophils Absolute: 0.1 10*3/uL (ref 0.0–0.1)
Basophils Relative: 1.1 % (ref 0.0–3.0)
Eosinophils Absolute: 0.3 10*3/uL (ref 0.0–0.7)
Eosinophils Relative: 5.8 % — ABNORMAL HIGH (ref 0.0–5.0)
HCT: 46.1 % (ref 39.0–52.0)
Hemoglobin: 15.6 g/dL (ref 13.0–17.0)
Lymphocytes Relative: 34.6 % (ref 12.0–46.0)
Lymphs Abs: 1.9 10*3/uL (ref 0.7–4.0)
MCHC: 33.9 g/dL (ref 30.0–36.0)
MCV: 92.4 fl (ref 78.0–100.0)
Monocytes Absolute: 0.6 10*3/uL (ref 0.1–1.0)
Monocytes Relative: 11.4 % (ref 3.0–12.0)
Neutro Abs: 2.6 10*3/uL (ref 1.4–7.7)
Neutrophils Relative %: 47.1 % (ref 43.0–77.0)
Platelets: 251 10*3/uL (ref 150.0–400.0)
RBC: 4.99 Mil/uL (ref 4.22–5.81)
RDW: 12.7 % (ref 11.5–15.5)
WBC: 5.5 10*3/uL (ref 4.0–10.5)

## 2019-07-12 LAB — HEPATIC FUNCTION PANEL
ALT: 44 U/L (ref 0–53)
AST: 31 U/L (ref 0–37)
Albumin: 4.5 g/dL (ref 3.5–5.2)
Alkaline Phosphatase: 43 U/L (ref 39–117)
Bilirubin, Direct: 0.1 mg/dL (ref 0.0–0.3)
Total Bilirubin: 0.4 mg/dL (ref 0.2–1.2)
Total Protein: 7.2 g/dL (ref 6.0–8.3)

## 2019-07-12 LAB — MEASLES/MUMPS/RUBELLA IMMUNITY
Mumps IgG: >300 AU/mL
Rubella: 30.2 Index
Rubeola IgG: >300 AU/mL

## 2019-07-12 LAB — VITAMIN D 25 HYDROXY (VIT D DEFICIENCY, FRACTURES): VITD: 21.37 ng/mL — ABNORMAL LOW (ref 30.00–100.00)

## 2019-07-12 LAB — HEMOGLOBIN A1C: Hgb A1c MFr Bld: 6 % (ref 4.6–6.5)

## 2019-07-12 LAB — IBC PANEL
Iron: 90 ug/dL (ref 42–165)
Saturation Ratios: 25.3 % (ref 20.0–50.0)
Transferrin: 254 mg/dL (ref 212.0–360.0)

## 2019-07-12 LAB — VITAMIN B12: Vitamin B-12: 1117 pg/mL — ABNORMAL HIGH (ref 211–911)

## 2019-07-12 LAB — TSH: TSH: 2.21 u[IU]/mL (ref 0.35–4.50)

## 2019-07-12 LAB — PSA: PSA: 1.35 ng/mL (ref 0.10–4.00)

## 2019-07-13 ENCOUNTER — Other Ambulatory Visit (INDEPENDENT_AMBULATORY_CARE_PROVIDER_SITE_OTHER): Payer: 59

## 2019-07-13 ENCOUNTER — Other Ambulatory Visit: Payer: Self-pay | Admitting: Internal Medicine

## 2019-07-13 ENCOUNTER — Ambulatory Visit (INDEPENDENT_AMBULATORY_CARE_PROVIDER_SITE_OTHER): Payer: 59

## 2019-07-13 DIAGNOSIS — M79641 Pain in right hand: Secondary | ICD-10-CM | POA: Diagnosis not present

## 2019-07-13 DIAGNOSIS — M79642 Pain in left hand: Secondary | ICD-10-CM

## 2019-07-13 DIAGNOSIS — M545 Low back pain, unspecified: Secondary | ICD-10-CM

## 2019-07-13 LAB — C-REACTIVE PROTEIN: CRP: <1 mg/dL (ref 0.5–20.0)

## 2019-07-13 LAB — SEDIMENTATION RATE: Sed Rate: 13 mm/hr (ref 0–20)

## 2019-07-13 MED ORDER — VITAMIN D (ERGOCALCIFEROL) 1.25 MG (50000 UNIT) PO CAPS: 50000.0000 [IU] | ORAL_CAPSULE | ORAL | 0 refills | Status: DC

## 2019-07-13 MED ORDER — ATORVASTATIN CALCIUM 10 MG PO TABS: 10.0000 mg | ORAL_TABLET | Freq: Every day | ORAL | 3 refills | Status: DC

## 2019-07-14 ENCOUNTER — Telehealth: Payer: Self-pay | Admitting: Internal Medicine

## 2019-07-14 DIAGNOSIS — R739 Hyperglycemia, unspecified: Secondary | ICD-10-CM

## 2019-07-14 DIAGNOSIS — E559 Vitamin D deficiency, unspecified: Secondary | ICD-10-CM

## 2019-07-14 DIAGNOSIS — E785 Hyperlipidemia, unspecified: Secondary | ICD-10-CM

## 2019-07-14 LAB — ANA: Anti Nuclear Antibody (ANA): NEGATIVE

## 2019-07-14 LAB — RHEUMATOID FACTOR: Rheumatoid fact SerPl-aCnc: <14 IU/mL (ref <14)

## 2019-07-14 LAB — CYCLIC CITRUL PEPTIDE ANTIBODY, IGG: Cyclic Citrullin Peptide Ab: <16 UNITS

## 2019-07-14 NOTE — Telephone Encounter (Signed)
[place] for f/u ROV at 6 mo (with labs done prior at first floor lab)

## 2019-07-14 NOTE — Telephone Encounter (Signed)
-----   Message from Micheal Grant, [place] sent at 07/13/2019  3:10 PM EDT ----- Patient given results. Your note mentioned you wanted to see him back in a year. However patient would like to know if you wanted to see him back sooner due to adding Lipitor for follow up and lab recheck (or) if you wanted to just recheck in a year. Please advise.  Thanks

## 2019-07-14 NOTE — Telephone Encounter (Signed)
Appointments scheduled with patient today.

## 2020-01-12 ENCOUNTER — Other Ambulatory Visit: Payer: 59

## 2020-01-17 ENCOUNTER — Ambulatory Visit: Payer: 59 | Admitting: Internal Medicine

## 2020-03-13 ENCOUNTER — Other Ambulatory Visit: Payer: 59

## 2020-03-16 ENCOUNTER — Ambulatory Visit: Payer: 59 | Admitting: Internal Medicine

## 2020-06-12 ENCOUNTER — Other Ambulatory Visit: Payer: Self-pay

## 2020-06-12 ENCOUNTER — Other Ambulatory Visit: Payer: 59

## 2020-06-12 ENCOUNTER — Other Ambulatory Visit (INDEPENDENT_AMBULATORY_CARE_PROVIDER_SITE_OTHER): Payer: 59

## 2020-06-12 DIAGNOSIS — E559 Vitamin D deficiency, unspecified: Secondary | ICD-10-CM

## 2020-06-12 DIAGNOSIS — R739 Hyperglycemia, unspecified: Secondary | ICD-10-CM | POA: Diagnosis not present

## 2020-06-12 DIAGNOSIS — E785 Hyperlipidemia, unspecified: Secondary | ICD-10-CM | POA: Diagnosis not present

## 2020-06-12 LAB — BASIC METABOLIC PANEL
BUN: 15 mg/dL (ref 6–23)
CO2: 28 mEq/L (ref 19–32)
Calcium: 9.4 mg/dL (ref 8.4–10.5)
Chloride: 104 mEq/L (ref 96–112)
Creatinine, Ser: 1.08 mg/dL (ref 0.40–1.50)
GFR: 76.56 mL/min (ref >60.00)
Glucose, Bld: 129 mg/dL — ABNORMAL HIGH (ref 70–99)
Potassium: 4.1 mEq/L (ref 3.5–5.1)
Sodium: 137 mEq/L (ref 135–145)

## 2020-06-12 LAB — HEMOGLOBIN A1C: Hgb A1c MFr Bld: 5.9 % (ref 4.6–6.5)

## 2020-06-12 LAB — LIPID PANEL
Cholesterol: 190 mg/dL (ref 0–200)
HDL: 45.9 mg/dL (ref >39.00)
LDL Cholesterol: 119 mg/dL — ABNORMAL HIGH (ref 0–99)
NonHDL: 143.66
Total CHOL/HDL Ratio: 4
Triglycerides: 121 mg/dL (ref 0.0–149.0)
VLDL: 24.2 mg/dL (ref 0.0–40.0)

## 2020-06-12 LAB — VITAMIN D 25 HYDROXY (VIT D DEFICIENCY, FRACTURES): VITD: 13.84 ng/mL — ABNORMAL LOW (ref 30.00–100.00)

## 2020-06-12 LAB — HEPATIC FUNCTION PANEL
ALT: 33 U/L (ref 0–53)
AST: 26 U/L (ref 0–37)
Albumin: 4.2 g/dL (ref 3.5–5.2)
Alkaline Phosphatase: 35 U/L — ABNORMAL LOW (ref 39–117)
Bilirubin, Direct: 0.1 mg/dL (ref 0.0–0.3)
Total Bilirubin: 0.5 mg/dL (ref 0.2–1.2)
Total Protein: 6.9 g/dL (ref 6.0–8.3)

## 2020-06-14 ENCOUNTER — Other Ambulatory Visit: Payer: Self-pay

## 2020-06-15 ENCOUNTER — Encounter: Payer: Self-pay | Admitting: Internal Medicine

## 2020-06-15 ENCOUNTER — Other Ambulatory Visit: Payer: Self-pay

## 2020-06-15 ENCOUNTER — Ambulatory Visit: Payer: 59 | Admitting: Internal Medicine

## 2020-06-15 VITALS — BP 142/80 | HR 63 | Temp 98.4°F | Ht 66.0 in | Wt 164.0 lb

## 2020-06-15 DIAGNOSIS — E7800 Pure hypercholesterolemia, unspecified: Secondary | ICD-10-CM | POA: Diagnosis not present

## 2020-06-15 DIAGNOSIS — I700 Atherosclerosis of aorta: Secondary | ICD-10-CM | POA: Diagnosis not present

## 2020-06-15 DIAGNOSIS — Z0001 Encounter for general adult medical examination with abnormal findings: Secondary | ICD-10-CM

## 2020-06-15 DIAGNOSIS — R7302 Impaired glucose tolerance (oral): Secondary | ICD-10-CM | POA: Diagnosis not present

## 2020-06-15 DIAGNOSIS — K582 Mixed irritable bowel syndrome: Secondary | ICD-10-CM

## 2020-06-15 DIAGNOSIS — F528 Other sexual dysfunction not due to a substance or known physiological condition: Secondary | ICD-10-CM

## 2020-06-15 DIAGNOSIS — E559 Vitamin D deficiency, unspecified: Secondary | ICD-10-CM | POA: Diagnosis not present

## 2020-06-15 DIAGNOSIS — R030 Elevated blood-pressure reading, without diagnosis of hypertension: Secondary | ICD-10-CM

## 2020-06-15 DIAGNOSIS — I10 Essential (primary) hypertension: Secondary | ICD-10-CM | POA: Insufficient documentation

## 2020-06-15 DIAGNOSIS — F411 Generalized anxiety disorder: Secondary | ICD-10-CM

## 2020-06-15 HISTORY — DX: Atherosclerosis of aorta: I70.0

## 2020-06-15 MED ORDER — ZOLPIDEM TARTRATE 10 MG PO TABS: ORAL_TABLET | ORAL | 1 refills | Status: DC

## 2020-06-15 MED ORDER — LORAZEPAM 0.5 MG PO TABS: 0.5000 mg | ORAL_TABLET | Freq: Every day | ORAL | 1 refills | Status: DC | PRN

## 2020-06-15 NOTE — Assessment & Plan Note (Signed)
With diarrhea much more predominant, but takes linzess prn infrequently as well

## 2020-06-15 NOTE — Progress Notes (Signed)
Patient ID: Micheal Grant, male   DOB: [birth year], 55 y.o.   MRN: [id number]         Chief Complaint:: wellness exam and Follow-up  elevated BP, low vit d, hld, hyperglycemia, anxiety, insomnia, conspation, and ED       HPI:  Micheal Grant is a 55 y.o. male here for wellness exam and is up to date with preventive measures, immunizations.                         Also BP has been < 140/90 at home, but not checked recently.  Not taking Vit D.  Has not been able to take the Lipitor due to Gi upset  Side effects, declines other statin for now and wants to follow diet and exercise, but also has significant FH father with hx of heart dz and is interested in CT cardiac score.  Has ongoing anxiety and insomnia with unable to get to sleep well most nights for the past few years, asks for med refills.  Has ongoing IBS symptoms predominantly diarrea but does occasionally have consipation as well and wants to keep some linzess on hand in that event.  Also has worsening ED symptoms in the past years, asks for viagra refill.     Wt Readings from Last 3 Encounters:  06/15/20 164 lb (74.4 kg)  07/11/19 167 lb (75.8 kg)  11/24/17 168 lb (76.2 kg)   BP Readings from Last 3 Encounters:  06/15/20 (!) 142/80  07/11/19 124/82  12/14/18 113/67   Immunization History  Administered Date(s) Administered  . Influenza,inj,Quad PF,6+ Mos 02/28/2016, 02/04/2017, 03/22/2018, 03/09/2020  . Influenza-Unspecified 03/31/2019  . PFIZER(Purple Top)SARS-COV-2 Vaccination 07/02/2019, 07/25/2019, 03/19/2020  . Tdap 02/28/2016   There are no preventive care reminders to display for this patient.   Past Medical History:  Diagnosis Date  . ABDOMINAL PAIN RIGHT LOWER QUADRANT 08/27/2009  . ADD 07/08/2007  . ANXIETY 07/08/2007  . Aortic atherosclerosis ([place]) 06/15/2020  . DEPRESSION 07/08/2007  . DIVERTICULOSIS, COLON 07/08/2007  . ERECTILE DYSFUNCTION 07/08/2007  . 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 FATIGUE  03/09/2008  . GLUCOSE INTOLERANCE 07/08/2007  . HYPERLIPIDEMIA 07/08/2007  . IBS 07/08/2007  . INSOMNIA-SLEEP DISORDER-UNSPEC 07/08/2007  . Rash and other nonspecific skin eruption 05/23/2008  . Throat pain 01/23/2010   Past Surgical History:  Procedure Laterality Date  . COLONOSCOPY    . none      reports that he has never smoked. He has never used smokeless tobacco. He reports current alcohol use. He reports that he does not use drugs. family history includes Depression in his sister; Diabetes in his mother; Heart disease in his father; Prostate cancer in his father. Allergies  Allergen Reactions  . Bupropion Hcl     REACTION: agitation  . Citalopram Hydrobromide     REACTION: agitation  . Desvenlafaxine     REACTION: agitation  . Fluoxetine Hcl     REACTION: agitation  . Lipitor [Atorvastatin] Diarrhea    GI upset  . Venlafaxine     REACTION: agitation   Current Outpatient Medications on File Prior to Visit  Medication Sig Dispense Refill  . linaclotide (LINZESS) 72 MCG capsule Take 1 capsule (72 mcg total) by mouth daily before breakfast. 30 capsule 1  . hyoscyamine (LEVSIN, ANASPAZ) 0.125 MG tablet Take 0.125 mg by mouth as needed. (Patient not taking: Reported on 06/15/2020)     No current facility-administered medications on file prior to visit.  ROS:  All others reviewed and negative.  Objective        PE:  BP (!) 142/80   Pulse 63   Temp 98.4 F (36.9 C) (Oral)   Ht 5\' 6"  (1.676 m)   Wt 164 lb (74.4 kg)   SpO2 96%   BMI 26.47 kg/m                 Constitutional: Pt appears in NAD               HENT: Head: NCAT.                Right Ear: External ear normal.                 Left Ear: External ear normal.                Eyes: . Pupils are equal, round, and reactive to light. Conjunctivae and EOM are normal               Nose: without d/c or deformity               Neck: Neck supple. Gross normal ROM               Cardiovascular: Normal rate and regular  rhythm.                 Pulmonary/Chest: Effort normal and breath sounds without rales or wheezing.                Abd:  Soft, NT, ND, + BS, no organomegaly               Neurological: Pt is alert. At baseline orientation, motor grossly intact               Skin: Skin is warm. No rashes, no other new lesions, LE edema - none               Psychiatric: Pt behavior is normal without agitation   Micro: none  Cardiac tracings I have personally interpreted today:  none  Pertinent Radiological findings (summarize): none   Lab Results  Component Value Date   WBC 5.5 07/11/2019   HGB 15.6 07/11/2019   HCT 46.1 07/11/2019   PLT 251.0 07/11/2019   GLUCOSE 129 (H) 06/12/2020   CHOL 190 06/12/2020   TRIG 121.0 06/12/2020   HDL 45.90 06/12/2020   LDLDIRECT 130.5 07/25/2010   LDLCALC 119 (H) 06/12/2020   ALT 33 06/12/2020   AST 26 06/12/2020   NA 137 06/12/2020   K 4.1 06/12/2020   CL 104 06/12/2020   CREATININE 1.08 06/12/2020   BUN 15 06/12/2020   CO2 28 06/12/2020   TSH 2.21 07/11/2019   PSA 1.35 07/11/2019   HGBA1C 5.9 06/12/2020   Assessment/Plan:  Micheal Grant is a 55 y.o. 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  IBS With diarrhea much more predominant, but takes linzess prn infrequently as well  Encounter for well adult exam with abnormal findings Age and sex appropriate education and counseling updated with regular exercise and diet Referrals for preventative  services -  none needed Immunizations addressed - none needed Smoking counseling  - none needed Evidence for depression or other mood disorder - none significant except anxiety below Most recent labs reviewed. I have personally reviewed and have noted: 1) the patient's medical and social history 2) The patient's current medications and supplements 3) The patient's height, weight, and BMI have been recorded in the chart   Anxiety state Stable, for med refill, ativan prn, to f/u any worsening symptoms or concerns   Aortic atherosclerosis ([facility]) Declines new statin restart for now given poor GI toleracne of lipitor, but will cont diet and wt control, also check CT Cardiac Score  Blood pressure elevated without history of HTN Pt to check BP at home daily for 10 days and call with average, o/w stable, no new meds for this now, to f/u any worsening symptoms or concerns  ERECTILE DYSFUNCTION Also for Viagra refill,  to f/u any worsening symptoms or concerns  HLD (hyperlipidemia) lipitor intolerant, declines different statin trial for now, will work on diet and wt control  Impaired glucose tolerance Lab Results  Component Value Date   HGBA1C 5.9 06/12/2020   Stable, pt to continue current medical treatment  - diet       Current Outpatient Medications (Other):  .  linaclotide (LINZESS) 72 MCG capsule, Take 1 capsule (72 mcg total) by mouth daily before breakfast. .  hyoscyamine (LEVSIN, ANASPAZ) 0.125 MG tablet, Take 0.125 mg by mouth as needed. (Patient not taking: Reported on 06/15/2020) .  LORazepam (ATIVAN) 0.5 MG tablet, Take 1 tablet (0.5 mg total) by mouth daily as needed for anxiety. Marland Kitchen  zolpidem (AMBIEN) 10 MG tablet, TAKE ONE TABLET BY MOUTH AT BEDTIME AS NEEDED   Vitamin D deficiency Last vitamin D Lab Results  Component Value Date   VD25OH 13.84 (L) 06/12/2020   Low, to start D3 2000 u oral replacement   Followup: Return in about 6 months (around  12/13/2020).  Cathlean Cower, MD 06/16/2020 1:09 AM [facility]

## 2020-06-15 NOTE — Patient Instructions (Addendum)
Please continue to monitor the BP at home as we discussed and mychart message the average in 10 days  Please take OTC Vitamin D3 at 2000 units per day, indefinitely.  Ok to stay off the lipitor, and all statins for now, and work on diet, wt loss  We have discussed the Cardiac CT Score test to measure the calcification level (if any) in your heart arteries.  This test has been ordered in our [facility], so please call [facility] directly, as they prefer this, at [phone] to be scheduled.  Please continue all other medications as before, including the linzess as needed even if infrequently, and viagra  Please have the pharmacy call with any other refills you may need.  Please continue your efforts at being more active, low cholesterol diet, and weight control.  Please keep your appointments with your specialists as you may have planned  Please make an Appointment to return in 6 months, or sooner if needed, also with Lab Appointment for testing done 3-5 days before at the [facility] (so this is for TWO appointments - please see the scheduling desk as you leave)  Due to the ongoing Covid 19 pandemic, our lab now requires an appointment for any labs done at our office.  If you need labs done and do not have an appointment, please call our office ahead of time to schedule before presenting to the lab for your testing.

## 2020-06-16 ENCOUNTER — Encounter: Payer: Self-pay | Admitting: Internal Medicine

## 2020-06-16 NOTE — Assessment & Plan Note (Signed)
lipitor intolerant, declines different statin trial for now, will work on diet and wt control

## 2020-06-16 NOTE — Assessment & Plan Note (Signed)
Lab Results  Component Value Date   HGBA1C 5.9 06/12/2020   Stable, pt to continue current medical treatment  - diet       Current Outpatient Medications (Other):  .  linaclotide (LINZESS) 72 MCG capsule, Take 1 capsule (72 mcg total) by mouth daily before breakfast. .  hyoscyamine (LEVSIN, ANASPAZ) 0.125 MG tablet, Take 0.125 mg by mouth as needed. (Patient not taking: Reported on 06/15/2020) .  LORazepam (ATIVAN) 0.5 MG tablet, Take 1 tablet (0.5 mg total) by mouth daily as needed for anxiety. Marland Kitchen  zolpidem (AMBIEN) 10 MG tablet, TAKE ONE TABLET BY MOUTH AT BEDTIME AS NEEDED

## 2020-06-16 NOTE — Assessment & Plan Note (Signed)
Also for Viagra refill,  to f/u any worsening symptoms or concerns

## 2020-06-16 NOTE — Assessment & Plan Note (Signed)
Stable, for med refill, ativan prn, to f/u any worsening symptoms or concerns

## 2020-06-16 NOTE — Assessment & Plan Note (Signed)
Declines new statin restart for now given poor GI toleracne of lipitor, but will cont diet and wt control, also check CT Cardiac Score

## 2020-06-16 NOTE — Assessment & Plan Note (Signed)
Last vitamin D Lab Results  Component Value Date   VD25OH 13.84 (L) 06/12/2020   Low, to start D3 2000 u oral replacement

## 2020-06-16 NOTE — Assessment & Plan Note (Signed)
Age and sex appropriate education and counseling updated with regular exercise and diet Referrals for preventative services - none needed Immunizations addressed - none needed Smoking counseling  - none needed Evidence for depression or other mood disorder - none significant except anxiety below Most recent labs reviewed. I have personally reviewed and have noted: 1) the patient's medical and social history 2) The patient's current medications and supplements 3) The patient's height, weight, and BMI have been recorded in the chart

## 2020-06-16 NOTE — Assessment & Plan Note (Signed)
Pt to check BP at home daily for 10 days and call with average, o/w stable, no new meds for this now, to f/u any worsening symptoms or concerns

## 2020-06-28 ENCOUNTER — Encounter: Payer: Self-pay | Admitting: Internal Medicine

## 2020-06-28 MED ORDER — LOSARTAN POTASSIUM 50 MG PO TABS: 50.0000 mg | ORAL_TABLET | Freq: Every day | ORAL | 3 refills | Status: DC

## 2020-07-09 ENCOUNTER — Ambulatory Visit: Payer: 59 | Admitting: Internal Medicine

## 2020-07-30 ENCOUNTER — Other Ambulatory Visit: Payer: Self-pay

## 2020-07-30 ENCOUNTER — Encounter: Payer: Self-pay | Admitting: Internal Medicine

## 2020-07-30 ENCOUNTER — Ambulatory Visit (INDEPENDENT_AMBULATORY_CARE_PROVIDER_SITE_OTHER)
Admission: RE | Admit: 2020-07-30 | Discharge: 2020-07-30 | Disposition: A | Discharge status: Home / Self Care | Payer: Self-pay | Source: Ambulatory Visit | Attending: Internal Medicine | Admitting: Internal Medicine

## 2020-07-30 DIAGNOSIS — I700 Atherosclerosis of aorta: Secondary | ICD-10-CM

## 2020-07-30 DIAGNOSIS — E7800 Pure hypercholesterolemia, unspecified: Secondary | ICD-10-CM

## 2020-07-30 DIAGNOSIS — R030 Elevated blood-pressure reading, without diagnosis of hypertension: Secondary | ICD-10-CM

## 2020-07-30 DIAGNOSIS — R7302 Impaired glucose tolerance (oral): Secondary | ICD-10-CM

## 2020-10-26 ENCOUNTER — Encounter: Payer: Self-pay | Admitting: Internal Medicine

## 2020-12-14 ENCOUNTER — Ambulatory Visit: Payer: 59 | Admitting: Internal Medicine

## 2021-01-07 ENCOUNTER — Telehealth: Payer: Self-pay | Admitting: Internal Medicine

## 2021-01-07 DIAGNOSIS — E559 Vitamin D deficiency, unspecified: Secondary | ICD-10-CM

## 2021-01-07 DIAGNOSIS — E7800 Pure hypercholesterolemia, unspecified: Secondary | ICD-10-CM

## 2021-01-07 DIAGNOSIS — R739 Hyperglycemia, unspecified: Secondary | ICD-10-CM

## 2021-01-07 NOTE — Telephone Encounter (Signed)
Ok labs ordered 

## 2021-01-07 NOTE — Telephone Encounter (Signed)
Requesting labs to be ordered for his upcoming appt on 01/10/2021 for his follow up.   Please advise

## 2021-01-09 ENCOUNTER — Other Ambulatory Visit (INDEPENDENT_AMBULATORY_CARE_PROVIDER_SITE_OTHER): Payer: 59

## 2021-01-09 ENCOUNTER — Other Ambulatory Visit: Payer: Self-pay

## 2021-01-09 DIAGNOSIS — E7800 Pure hypercholesterolemia, unspecified: Secondary | ICD-10-CM

## 2021-01-09 DIAGNOSIS — R739 Hyperglycemia, unspecified: Secondary | ICD-10-CM

## 2021-01-09 DIAGNOSIS — E559 Vitamin D deficiency, unspecified: Secondary | ICD-10-CM

## 2021-01-09 LAB — LIPID PANEL
Cholesterol: 196 mg/dL (ref 0–200)
HDL: 49.4 mg/dL (ref >39.00)
LDL Cholesterol: 116 mg/dL — ABNORMAL HIGH (ref 0–99)
NonHDL: 147.08
Total CHOL/HDL Ratio: 4
Triglycerides: 155 mg/dL — ABNORMAL HIGH (ref 0.0–149.0)
VLDL: 31 mg/dL (ref 0.0–40.0)

## 2021-01-09 LAB — VITAMIN D 25 HYDROXY (VIT D DEFICIENCY, FRACTURES): VITD: 20.91 ng/mL — ABNORMAL LOW (ref 30.00–100.00)

## 2021-01-09 LAB — HEPATIC FUNCTION PANEL
ALT: 34 U/L (ref 0–53)
AST: 27 U/L (ref 0–37)
Albumin: 4.2 g/dL (ref 3.5–5.2)
Alkaline Phosphatase: 40 U/L (ref 39–117)
Bilirubin, Direct: 0.1 mg/dL (ref 0.0–0.3)
Total Bilirubin: 0.5 mg/dL (ref 0.2–1.2)
Total Protein: 6.8 g/dL (ref 6.0–8.3)

## 2021-01-09 LAB — BASIC METABOLIC PANEL
BUN: 12 mg/dL (ref 6–23)
CO2: 27 mEq/L (ref 19–32)
Calcium: 9.4 mg/dL (ref 8.4–10.5)
Chloride: 104 mEq/L (ref 96–112)
Creatinine, Ser: 1.07 mg/dL (ref 0.40–1.50)
GFR: 77.1 mL/min (ref >60.00)
Glucose, Bld: 85 mg/dL (ref 70–99)
Potassium: 3.8 mEq/L (ref 3.5–5.1)
Sodium: 139 mEq/L (ref 135–145)

## 2021-01-09 LAB — HEMOGLOBIN A1C: Hgb A1c MFr Bld: 6.1 % (ref 4.6–6.5)

## 2021-01-10 ENCOUNTER — Other Ambulatory Visit: Payer: 59

## 2021-01-10 ENCOUNTER — Ambulatory Visit: Payer: 59 | Admitting: Internal Medicine

## 2021-01-17 ENCOUNTER — Other Ambulatory Visit: Payer: Self-pay

## 2021-01-17 ENCOUNTER — Encounter: Payer: Self-pay | Admitting: Internal Medicine

## 2021-01-17 ENCOUNTER — Ambulatory Visit: Payer: 59 | Admitting: Internal Medicine

## 2021-01-17 VITALS — BP 118/68 | HR 53 | Temp 98.2°F | Ht 66.0 in | Wt 163.0 lb

## 2021-01-17 DIAGNOSIS — F528 Other sexual dysfunction not due to a substance or known physiological condition: Secondary | ICD-10-CM | POA: Diagnosis not present

## 2021-01-17 DIAGNOSIS — E538 Deficiency of other specified B group vitamins: Secondary | ICD-10-CM

## 2021-01-17 DIAGNOSIS — I10 Essential (primary) hypertension: Secondary | ICD-10-CM | POA: Diagnosis not present

## 2021-01-17 DIAGNOSIS — E785 Hyperlipidemia, unspecified: Secondary | ICD-10-CM

## 2021-01-17 DIAGNOSIS — Z23 Encounter for immunization: Secondary | ICD-10-CM

## 2021-01-17 DIAGNOSIS — E559 Vitamin D deficiency, unspecified: Secondary | ICD-10-CM | POA: Diagnosis not present

## 2021-01-17 DIAGNOSIS — K582 Mixed irritable bowel syndrome: Secondary | ICD-10-CM

## 2021-01-17 DIAGNOSIS — R739 Hyperglycemia, unspecified: Secondary | ICD-10-CM

## 2021-01-17 DIAGNOSIS — Z0000 Encounter for general adult medical examination without abnormal findings: Secondary | ICD-10-CM

## 2021-01-17 DIAGNOSIS — R7302 Impaired glucose tolerance (oral): Secondary | ICD-10-CM | POA: Diagnosis not present

## 2021-01-17 MED ORDER — AMLODIPINE BESYLATE 5 MG PO TABS: 5.0000 mg | ORAL_TABLET | Freq: Every day | ORAL | 3 refills | Status: DC

## 2021-01-17 MED ORDER — SILDENAFIL CITRATE 100 MG PO TABS: 50.0000 mg | ORAL_TABLET | Freq: Every day | ORAL | 11 refills | Status: DC | PRN

## 2021-01-17 NOTE — Progress Notes (Signed)
Patient ID: Micheal Grant, male   DOB: 10-22-1965, 55 y.o.   MRN: [id number]        Chief Complaint: follow up HTN, HLD and hyperglycemia, low vit d       HPI:  Micheal Grant is a 55 y.o. male here with c/o recent intermittent midl abd crampy pain without n/v, wt loss, fever or bowel change; he believes this is caused by losartan and asks for change.  Currently taking Vit D 2000 u qd.    Trying to follow low salt low chol diet, does not want statin at this time.  Does also have worsening ED symptoms in the past several months, asks for med tx.  Pt denies chest pain, increased sob or doe, wheezing, orthopnea, PND, increased LE swelling, palpitations, dizziness or syncope.   Pt denies polydipsia, polyuria, or new focal neuro s/s.   Pt denies fever, wt loss, night sweats, loss of appetite, or other constitutional symptoms   Due for shingles shot #2 today Wt Readings from Last 3 Encounters:  01/17/21 163 lb (73.9 kg)  06/15/20 164 lb (74.4 kg)  07/11/19 167 lb (75.8 kg)   BP Readings from Last 3 Encounters:  01/17/21 118/68  06/15/20 (!) 142/80  07/11/19 124/82         Past Medical History:  Diagnosis Date   ABDOMINAL PAIN RIGHT LOWER QUADRANT 08/27/2009   ADD 07/08/2007   ANXIETY 07/08/2007   Aortic atherosclerosis ([place]) 06/15/2020   DEPRESSION 07/08/2007   DIVERTICULOSIS, COLON 07/08/2007   ERECTILE DYSFUNCTION 07/08/2007   ERECTILE DYSFUNCTION, 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 He has never used smokeless tobacco. He reports current alcohol use. He reports that he does not use drugs. family history includes Depression in his sister; Diabetes in his mother; Heart  disease in his father; Prostate cancer in his father. Allergies  Allergen Reactions   Bupropion Hcl     REACTION: agitation   Citalopram Hydrobromide     REACTION: agitation   Desvenlafaxine     REACTION: agitation   Fluoxetine Hcl     REACTION: agitation   Lipitor [Atorvastatin] Diarrhea    GI upset   Venlafaxine     REACTION: agitation   Current Outpatient Medications on File Prior to Visit  Medication Sig Dispense Refill   linaclotide (LINZESS) 72 MCG capsule Take 1 capsule (72 mcg total) by mouth daily before breakfast. 30 capsule 1   LORazepam (ATIVAN) 0.5 MG tablet Take 1 tablet (0.5 mg total) by mouth daily as needed for anxiety. 90 tablet 1   zolpidem (AMBIEN) 10 MG tablet TAKE ONE TABLET BY MOUTH AT BEDTIME AS NEEDED 90 tablet 1   hyoscyamine (LEVSIN, ANASPAZ) 0.125 MG tablet Take 0.125 mg by mouth as needed. (Patient not taking: No sig reported)     No current facility-administered medications on file prior to visit.        ROS:  All others reviewed and negative.  Objective        PE:  BP 118/68 (BP Location: Left Arm, Patient Position: Sitting, Cuff Size: Normal)   Pulse (!) 53   Temp 98.2 F (36.8 C) (Oral)  Ht 5\' 6"  (1.676 m)   Wt 163 lb (73.9 kg)   SpO2 98%   BMI 26.31 kg/m                 Constitutional: Pt appears in NAD               HENT: Head: NCAT.                Right Ear: External ear normal.                 Left Ear: External ear normal.                Eyes: . Pupils are equal, round, and reactive to light. Conjunctivae and EOM are normal               Nose: without d/c or deformity               Neck: Neck supple. Gross normal ROM               Cardiovascular: Normal rate and regular rhythm.                 Pulmonary/Chest: Effort normal and breath sounds without rales or wheezing.                Abd:  Soft, NT, ND, + BS, no organomegaly               Neurological: Pt is alert. At baseline orientation, motor grossly intact               Skin:  Skin is warm. No rashes, no other new lesions, LE edema - none               Psychiatric: Pt behavior is normal without agitation   Micro: none  Cardiac tracings I have personally interpreted today:  none  Pertinent Radiological findings (summarize): none   Lab Results  Component Value Date   WBC 5.5 07/11/2019   HGB 15.6 07/11/2019   HCT 46.1 07/11/2019   PLT 251.0 07/11/2019   GLUCOSE 85 01/09/2021   CHOL 196 01/09/2021   TRIG 155.0 (H) 01/09/2021   HDL 49.40 01/09/2021   LDLDIRECT 130.5 07/25/2010   LDLCALC 116 (H) 01/09/2021   ALT 34 01/09/2021   AST 27 01/09/2021   NA 139 01/09/2021   K 3.8 01/09/2021   CL 104 01/09/2021   CREATININE 1.07 01/09/2021   BUN 12 01/09/2021   CO2 27 01/09/2021   TSH 2.21 07/11/2019   PSA 1.35 07/11/2019   HGBA1C 6.1 01/09/2021   Assessment/Plan:  Micheal Grant is a 55 y.o. Other or two or more races [6] male with  has a past medical history of ABDOMINAL PAIN RIGHT LOWER QUADRANT (08/27/2009), ADD (07/08/2007), ANXIETY (07/08/2007), Aortic atherosclerosis ([place]) (06/15/2020), DEPRESSION (07/08/2007), DIVERTICULOSIS, COLON (07/08/2007), ERECTILE DYSFUNCTION (07/08/2007), ERECTILE DYSFUNCTION, ORGANIC (07/18/2009), FATIGUE (03/09/2008), GLUCOSE INTOLERANCE (07/08/2007), HYPERLIPIDEMIA (07/08/2007), IBS (07/08/2007), INSOMNIA-SLEEP DISORDER-UNSPEC (07/08/2007), Rash and other nonspecific skin eruption (05/23/2008), and Throat pain (01/23/2010).  Vitamin D deficiency Last vitamin D Lab Results  Component Value Date   VD25OH 20.91 (L) 01/09/2021   Low, to increase oral replacement to 4000 u qd  Impaired glucose tolerance Lab Results  Component Value Date   HGBA1C 6.1 01/09/2021   Stable, pt to continue current medical treatment  - diet   IBS I suspect this is cause of his GI complaints, cont to follow  HLD (hyperlipidemia) Lab Results  Component Value Date   LDLCALC 116 (H) 01/09/2021   uncontrolled, goal ldl < 100, pt to continue low chol  diet, declines statin trial   ERECTILE DYSFUNCTION Ok for viagra prn,  to f/u any worsening symptoms or concerns  HTN (hypertension) I d/w pt the GI complaints are not likely to be related to losartan, but will try change to amlodipine to help assure him; consider change back to losartan if no change in GI symptoms  Followup: No follow-ups on file.  Cathlean Cower, MD 01/20/2021 3:07 PM [facility]

## 2021-01-17 NOTE — Patient Instructions (Addendum)
You had the Shingle shot #1 today, and we can plan for the Shingles shot #2 next visit  Please take all new medication as prescribed - the viagra as needed  Ok to change the losartan to amlodipine 5 mg per day  Please continue all other medications as before, and refills have been done if requested.  Please have the pharmacy call with any other refills you may need.  Please continue your efforts at being more active, low cholesterol diet, and weight control.  Please keep your appointments with your specialists as you may have planned  Please make an Appointment to return in 6 months, or sooner if needed, also with Lab Appointment for testing done 3-5 days before at the [facility] (so this is for TWO appointments - please see the scheduling desk as you leave)  Due to the ongoing Covid 19 pandemic, our lab now requires an appointment for any labs done at our office.  If you need labs done and do not have an appointment, please call our office ahead of time to schedule before presenting to the lab for your testing.

## 2021-01-20 NOTE — Assessment & Plan Note (Addendum)
Last vitamin D Lab Results  Component Value Date   VD25OH 20.91 (L) 01/09/2021   Low, to increase oral replacement to 4000 u qd

## 2021-01-20 NOTE — Assessment & Plan Note (Signed)
Lab Results  Component Value Date   HGBA1C 6.1 01/09/2021   Stable, pt to continue current medical treatment  - diet

## 2021-01-20 NOTE — Assessment & Plan Note (Signed)
I suspect this is cause of his GI complaints, cont to follow

## 2021-01-20 NOTE — Assessment & Plan Note (Signed)
Lab Results  Component Value Date   LDLCALC 116 (H) 01/09/2021   uncontrolled, goal ldl < 100, pt to continue low chol diet, declines statin trial

## 2021-01-20 NOTE — Assessment & Plan Note (Signed)
I d/w pt the GI complaints are not likely to be related to losartan, but will try change to amlodipine to help assure him; consider change back to losartan if no change in GI symptoms

## 2021-01-20 NOTE — Assessment & Plan Note (Signed)
Ok for viagra prn,  to f/u any worsening symptoms or concerns  

## 2021-01-21 IMAGING — DX DG HAND COMPLETE 3+V*R*
3 series · 3 of 3 positions shown · non-contrast
Comparison: None.

CLINICAL DATA: Pain in both hands, right greater than left.

EXAM:
RIGHT HAND - COMPLETE 3+ VIEW

[hand pa]
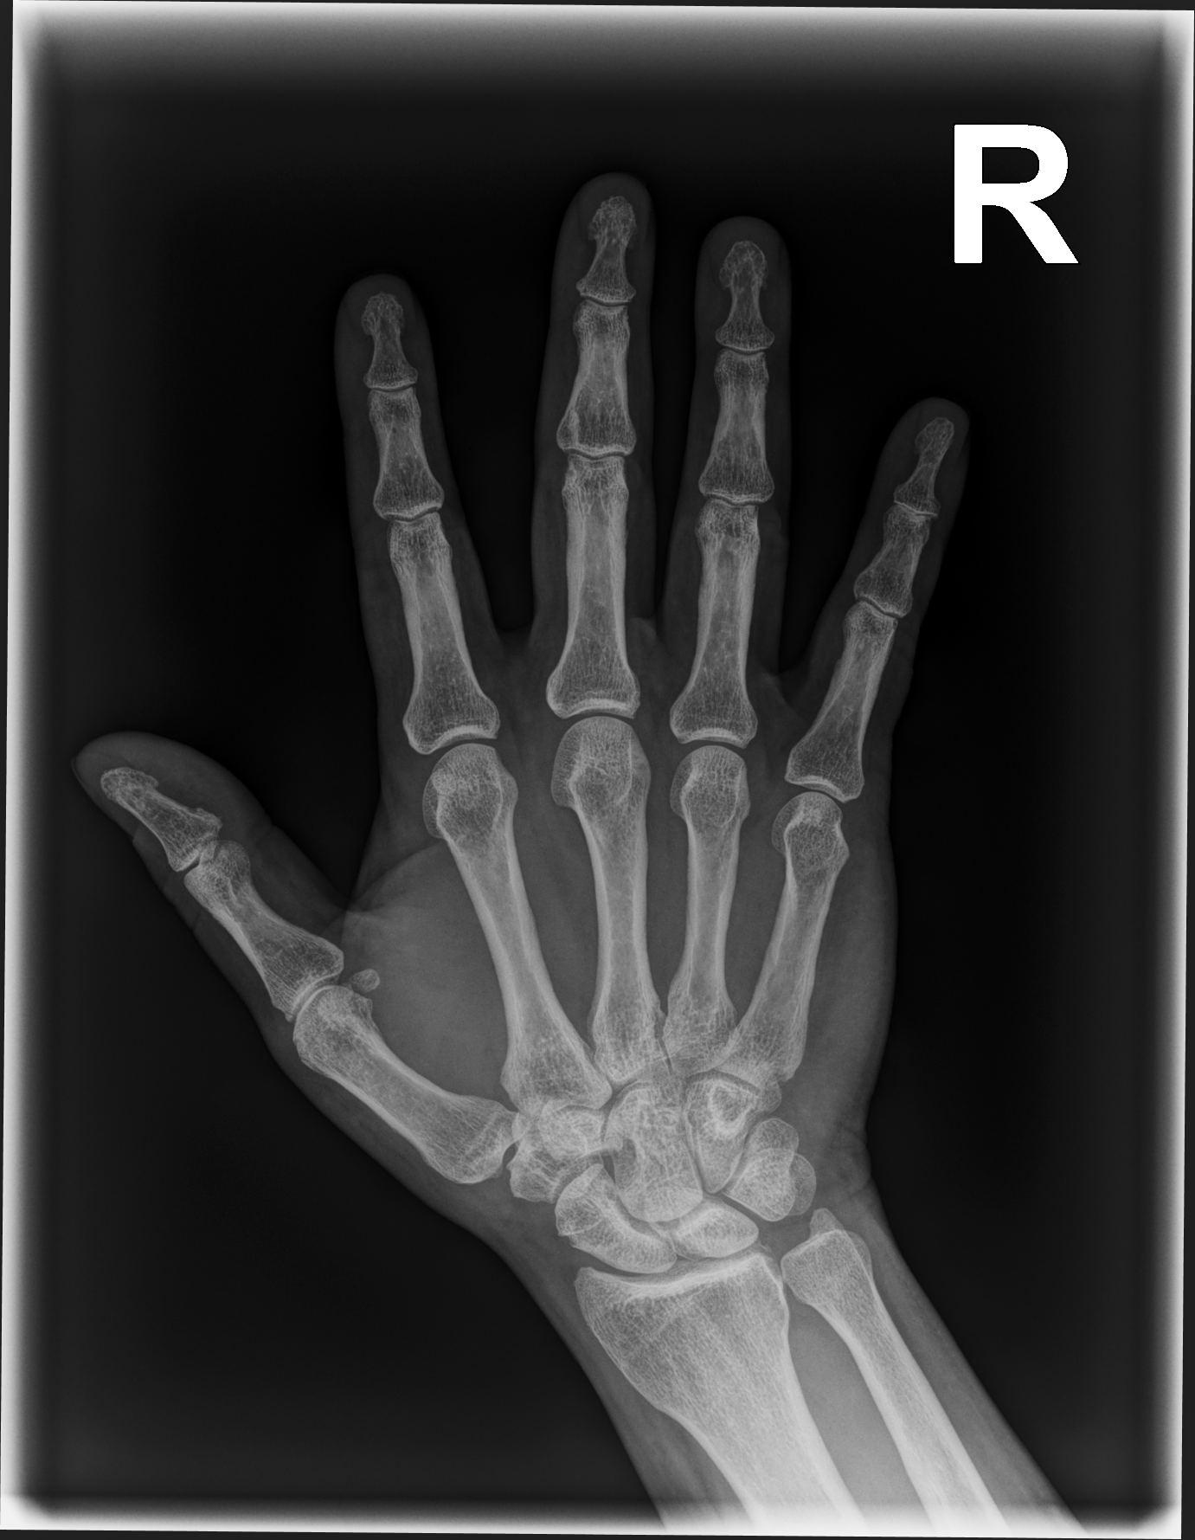

[hand mlo]
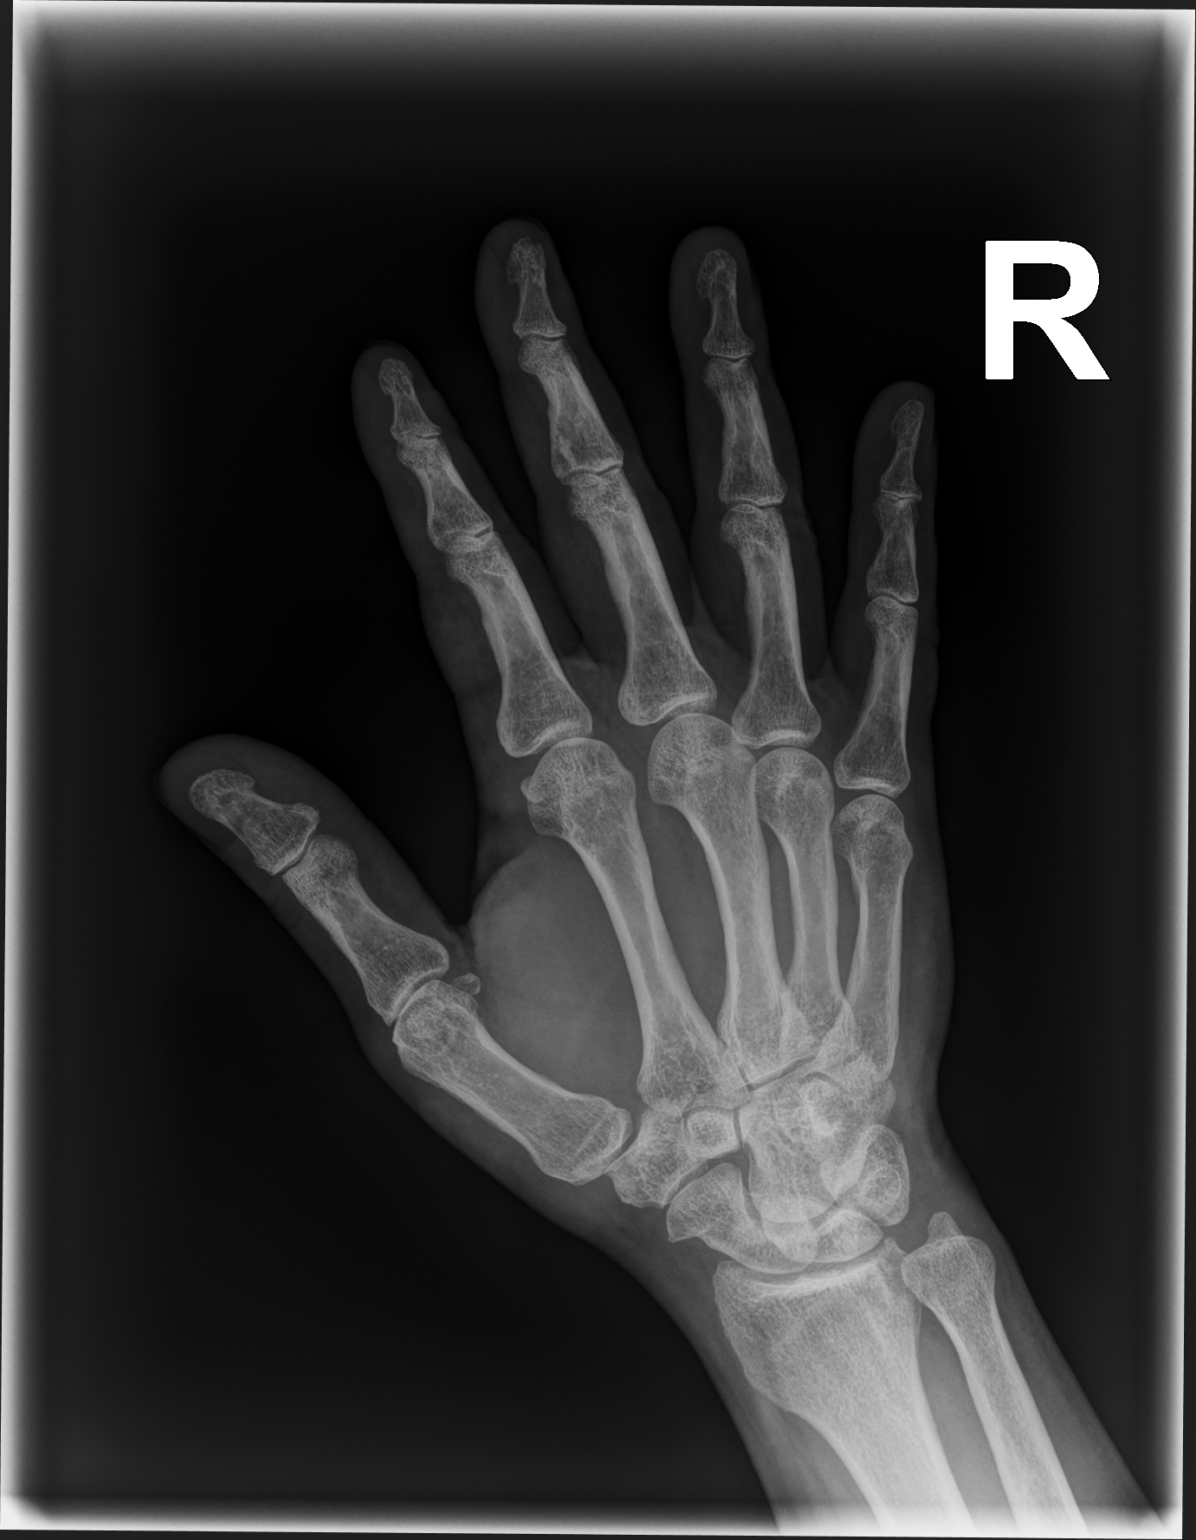

[hand lat]
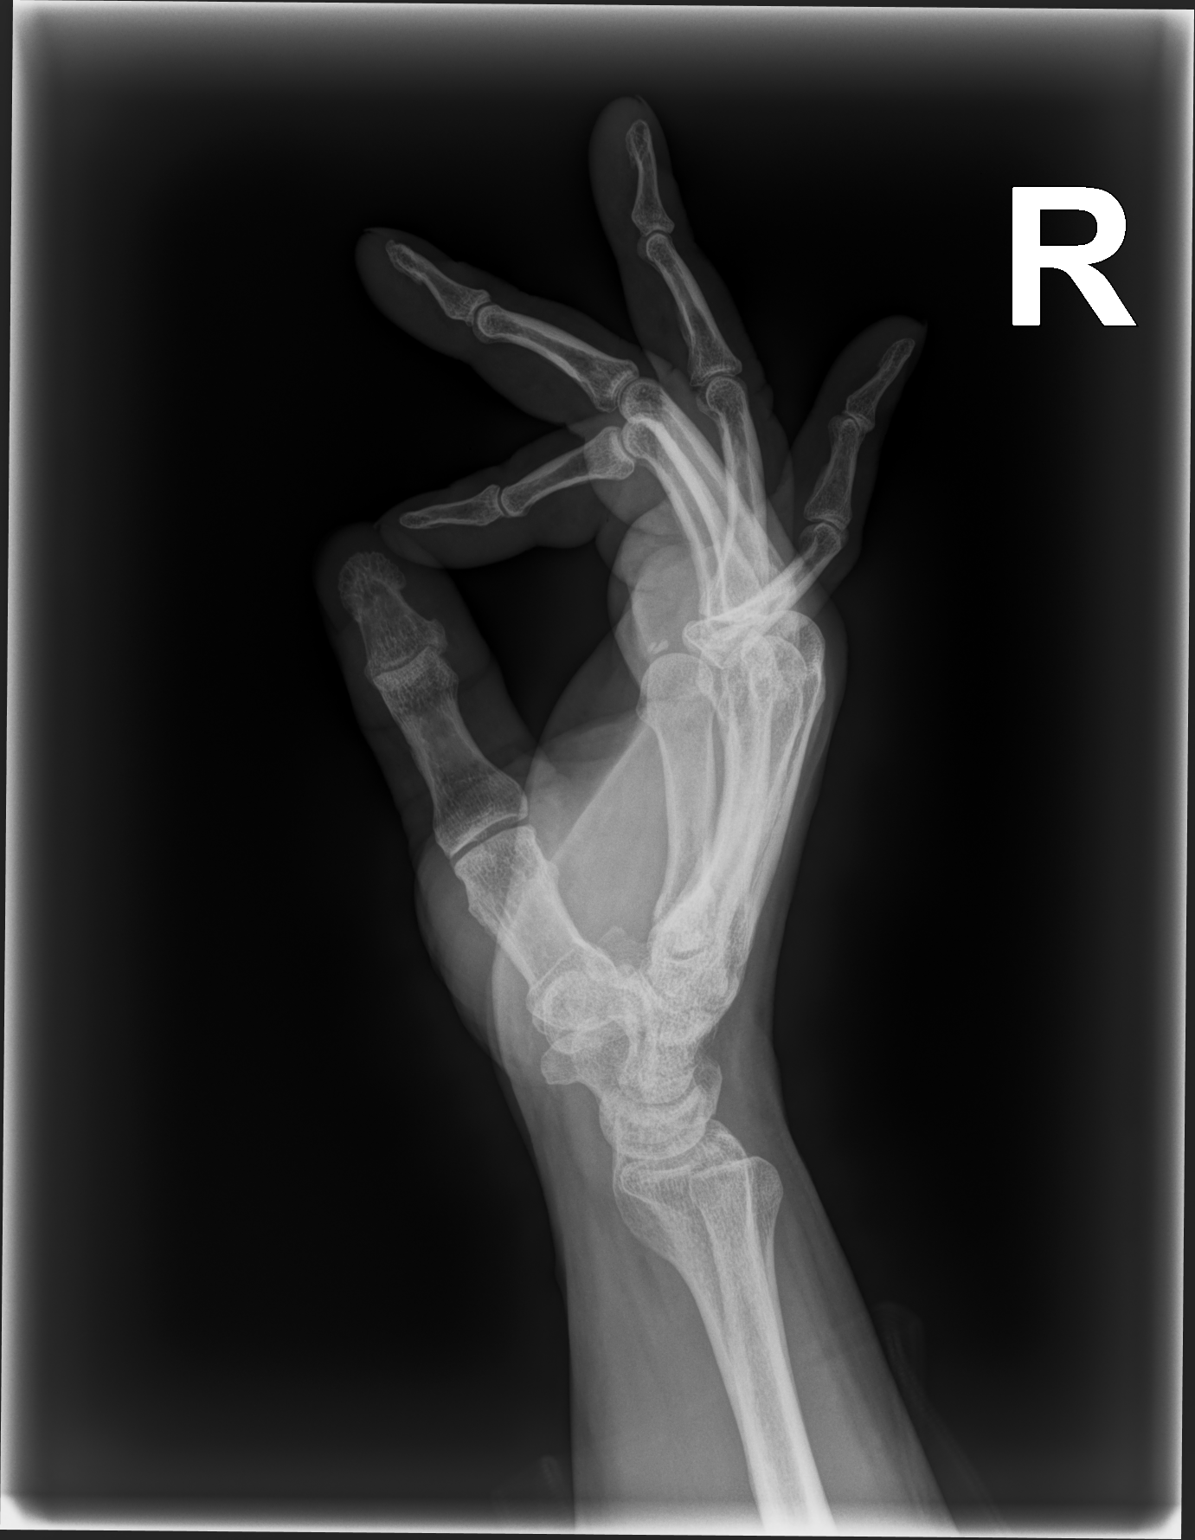

[3 of 3 positions shown; findings below may reference images not displayed]

FINDINGS: No fracture or bone lesion.

Joints are normally spaced and aligned.  No arthropathic changes.

Soft tissues are unremarkable.
IMPRESSION: Negative.

## 2021-06-13 ENCOUNTER — Encounter: Payer: Self-pay | Admitting: Internal Medicine

## 2021-06-13 MED ORDER — LORAZEPAM 0.5 MG PO TABS: 0.5000 mg | ORAL_TABLET | Freq: Every day | ORAL | 1 refills | Status: DC | PRN

## 2021-06-13 MED ORDER — ZOLPIDEM TARTRATE 10 MG PO TABS: ORAL_TABLET | ORAL | 1 refills | Status: DC

## 2021-07-10 ENCOUNTER — Other Ambulatory Visit: Payer: Self-pay

## 2021-07-10 ENCOUNTER — Ambulatory Visit (INDEPENDENT_AMBULATORY_CARE_PROVIDER_SITE_OTHER): Payer: 59

## 2021-07-10 DIAGNOSIS — Z23 Encounter for immunization: Secondary | ICD-10-CM | POA: Diagnosis not present

## 2021-07-10 NOTE — Progress Notes (Signed)
Pt came in for 2nd shingles vaccination. Vaccine was given and pt tolerated well. ?

## 2021-07-25 ENCOUNTER — Other Ambulatory Visit: Payer: Self-pay | Admitting: Internal Medicine

## 2021-07-25 NOTE — Telephone Encounter (Signed)
Please refill as per office routine med refill policy (all routine meds to be refilled for 3 mo or monthly (per pt preference) up to one year from last visit, then month to month grace period for 3 mo, then further med refills will have to be denied) ? ?

## 2021-12-11 ENCOUNTER — Ambulatory Visit: Payer: 59 | Admitting: Internal Medicine

## 2021-12-11 ENCOUNTER — Encounter: Payer: Self-pay | Admitting: Internal Medicine

## 2021-12-11 VITALS — BP 116/62 | HR 99 | Temp 98.3°F | Ht 66.0 in | Wt 162.0 lb

## 2021-12-11 DIAGNOSIS — E538 Deficiency of other specified B group vitamins: Secondary | ICD-10-CM

## 2021-12-11 DIAGNOSIS — Z0001 Encounter for general adult medical examination with abnormal findings: Secondary | ICD-10-CM | POA: Diagnosis not present

## 2021-12-11 DIAGNOSIS — Z1211 Encounter for screening for malignant neoplasm of colon: Secondary | ICD-10-CM

## 2021-12-11 DIAGNOSIS — E785 Hyperlipidemia, unspecified: Secondary | ICD-10-CM

## 2021-12-11 DIAGNOSIS — R7302 Impaired glucose tolerance (oral): Secondary | ICD-10-CM

## 2021-12-11 DIAGNOSIS — E559 Vitamin D deficiency, unspecified: Secondary | ICD-10-CM

## 2021-12-11 DIAGNOSIS — R739 Hyperglycemia, unspecified: Secondary | ICD-10-CM

## 2021-12-11 DIAGNOSIS — F32A Depression, unspecified: Secondary | ICD-10-CM

## 2021-12-11 DIAGNOSIS — Z125 Encounter for screening for malignant neoplasm of prostate: Secondary | ICD-10-CM | POA: Diagnosis not present

## 2021-12-11 DIAGNOSIS — I10 Essential (primary) hypertension: Secondary | ICD-10-CM

## 2021-12-11 DIAGNOSIS — Z0000 Encounter for general adult medical examination without abnormal findings: Secondary | ICD-10-CM

## 2021-12-11 LAB — VITAMIN B12: Vitamin B-12: 1280 pg/mL — ABNORMAL HIGH (ref 211–911)

## 2021-12-11 LAB — URINALYSIS, ROUTINE W REFLEX MICROSCOPIC
Bilirubin Urine: NEGATIVE
Hgb urine dipstick: NEGATIVE
Ketones, ur: NEGATIVE
Leukocytes,Ua: NEGATIVE
Nitrite: NEGATIVE
RBC / HPF: NONE SEEN (ref 0–?)
Specific Gravity, Urine: 1.01 (ref 1.000–1.030)
Total Protein, Urine: NEGATIVE
Urine Glucose: NEGATIVE
Urobilinogen, UA: 0.2 (ref 0.0–1.0)
pH: 6 (ref 5.0–8.0)

## 2021-12-11 LAB — CBC WITH DIFFERENTIAL/PLATELET
Basophils Absolute: 0.1 10*3/uL (ref 0.0–0.1)
Basophils Relative: 1.3 % (ref 0.0–3.0)
Eosinophils Absolute: 0.1 10*3/uL (ref 0.0–0.7)
Eosinophils Relative: 2.6 % (ref 0.0–5.0)
HCT: 42.7 % (ref 39.0–52.0)
Hemoglobin: 14.4 g/dL (ref 13.0–17.0)
Lymphocytes Relative: 36.6 % (ref 12.0–46.0)
Lymphs Abs: 2 10*3/uL (ref 0.7–4.0)
MCHC: 33.8 g/dL (ref 30.0–36.0)
MCV: 92.6 fl (ref 78.0–100.0)
Monocytes Absolute: 0.6 10*3/uL (ref 0.1–1.0)
Monocytes Relative: 10.5 % (ref 3.0–12.0)
Neutro Abs: 2.6 10*3/uL (ref 1.4–7.7)
Neutrophils Relative %: 49 % (ref 43.0–77.0)
Platelets: 228 10*3/uL (ref 150.0–400.0)
RBC: 4.61 Mil/uL (ref 4.22–5.81)
RDW: 12.6 % (ref 11.5–15.5)
WBC: 5.3 10*3/uL (ref 4.0–10.5)

## 2021-12-11 LAB — BASIC METABOLIC PANEL
BUN: 18 mg/dL (ref 6–23)
CO2: 29 mEq/L (ref 19–32)
Calcium: 9.6 mg/dL (ref 8.4–10.5)
Chloride: 102 mEq/L (ref 96–112)
Creatinine, Ser: 1.02 mg/dL (ref 0.40–1.50)
GFR: 81.13 mL/min (ref >60.00)
Glucose, Bld: 96 mg/dL (ref 70–99)
Potassium: 4.5 mEq/L (ref 3.5–5.1)
Sodium: 137 mEq/L (ref 135–145)

## 2021-12-11 LAB — HEPATIC FUNCTION PANEL
ALT: 29 U/L (ref 0–53)
AST: 27 U/L (ref 0–37)
Albumin: 4.4 g/dL (ref 3.5–5.2)
Alkaline Phosphatase: 40 U/L (ref 39–117)
Bilirubin, Direct: 0.1 mg/dL (ref 0.0–0.3)
Total Bilirubin: 0.5 mg/dL (ref 0.2–1.2)
Total Protein: 6.8 g/dL (ref 6.0–8.3)

## 2021-12-11 LAB — TSH: TSH: 2.68 u[IU]/mL (ref 0.35–5.50)

## 2021-12-11 LAB — LIPID PANEL
Cholesterol: 183 mg/dL (ref 0–200)
HDL: 49.4 mg/dL (ref >39.00)
LDL Cholesterol: 108 mg/dL — ABNORMAL HIGH (ref 0–99)
NonHDL: 133.65
Total CHOL/HDL Ratio: 4
Triglycerides: 127 mg/dL (ref 0.0–149.0)
VLDL: 25.4 mg/dL (ref 0.0–40.0)

## 2021-12-11 LAB — HEMOGLOBIN A1C: Hgb A1c MFr Bld: 6.2 % (ref 4.6–6.5)

## 2021-12-11 LAB — PSA: PSA: 1.48 ng/mL (ref 0.10–4.00)

## 2021-12-11 LAB — VITAMIN D 25 HYDROXY (VIT D DEFICIENCY, FRACTURES): VITD: 36.04 ng/mL (ref 30.00–100.00)

## 2021-12-11 MED ORDER — LORAZEPAM 0.5 MG PO TABS
0.5000 mg | ORAL_TABLET | Freq: Every day | ORAL | 1 refills | Status: DC | PRN
Start: 2021-12-11 — End: 2023-06-23

## 2021-12-11 MED ORDER — LOSARTAN POTASSIUM 50 MG PO TABS: 50.0000 mg | ORAL_TABLET | Freq: Every day | ORAL | 3 refills | Status: DC

## 2021-12-11 MED ORDER — ZOLPIDEM TARTRATE 10 MG PO TABS: ORAL_TABLET | ORAL | 1 refills | Status: DC

## 2021-12-11 NOTE — Patient Instructions (Signed)
Please continue all other medications as before, and refills have been done if requested.  Please have the pharmacy call with any other refills you may need.  Please continue your efforts at being more active, low cholesterol diet, and weight control.  You are otherwise up to date with prevention measures today.  Please keep your appointments with your specialists as you may have planned  You will be contacted regarding the referral for: colonoscopy with Dr Loletha Carrow  Please go to the LAB at the blood drawing area for the tests to be done  You will be contacted by phone if any changes need to be made immediately.  Otherwise, you will receive a letter about your results with an explanation, but please check with MyChart first.  Please remember to sign up for MyChart if you have not done so, as this will be important to you in the future with finding out test results, communicating by private email, and scheduling acute appointments online when needed.  Please make an Appointment to return for your 1 year visit, or sooner if needed, with Lab testing by Appointment as well, to be done about 3-5 days before at the [facility] (so this is for TWO appointments - please see the scheduling desk as you leave)

## 2021-12-11 NOTE — Progress Notes (Signed)
Patient ID: Micheal Grant, male   DOB: 29-Jul-1965, 56 y.o.   MRN: [id number]         Chief Complaint:: wellness exam and Physical (Needing referral for colonoscopy)  , low vit d, depression, hld, hyperglycemia       HPI:  Micheal Grant is a 56 y.o. male here for wellness exam; declines covid booster, due for colonoscopy, o/w up to date                        Also not taking Vit D.  Has ongoing mild chronic depressive symptoms, but no suicidal ideation, or panic; Does not want statin, wants to continue lower chol diet.  Pt denies chest pain, increased sob or doe, wheezing, orthopnea, PND, increased LE swelling, palpitations, dizziness or syncope.   Pt denies polydipsia, polyuria, or new focal neuro s/s.    Pt denies fever, wt loss, night sweats, loss of appetite, or other constitutional symptoms     Wt Readings from Last 3 Encounters:  12/11/21 162 lb (73.5 kg)  01/17/21 163 lb (73.9 kg)  06/15/20 164 lb (74.4 kg)   BP Readings from Last 3 Encounters:  12/11/21 116/62  01/17/21 118/68  06/15/20 (!) 142/80   Immunization History  Administered Date(s) Administered   Influenza,inj,Quad PF,6+ Mos 02/28/2016, 02/04/2017, 03/22/2018, 03/09/2020   Influenza-Unspecified 03/31/2019, 04/11/2021   Moderna Sars-Covid-2 Vaccination 11/13/2020   PFIZER(Purple Top)SARS-COV-2 Vaccination 07/02/2019, 07/25/2019, 03/19/2020   Tdap 02/28/2016   Zoster Recombinat (Shingrix) 01/17/2021, 07/10/2021   Health Maintenance Due  Topic Date Due   COLONOSCOPY (Pts 45-43yr Insurance coverage will need to be confirmed)  09/08/2021      Past Medical History:  Diagnosis Date   ABDOMINAL PAIN RIGHT LOWER QUADRANT 08/27/2009   ADD 07/08/2007   ANXIETY 07/08/2007   Aortic atherosclerosis ([place] 06/15/2020   DEPRESSION 07/08/2007   DIVERTICULOSIS, COLON 07/08/2007   ERECTILE DYSFUNCTION 07/08/2007   ERECTILE DYSFUNCTION, 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 He has never used smokeless tobacco. He reports current alcohol use. He reports that he does not use drugs. family history includes Depression in his sister; Diabetes in his mother; Heart disease in his father; Prostate cancer in his father. Allergies  Allergen Reactions   Bupropion Hcl     REACTION: agitation   Citalopram Hydrobromide     REACTION: agitation   Desvenlafaxine     REACTION: agitation   Fluoxetine Hcl     REACTION: agitation   Lipitor [Atorvastatin] Diarrhea    GI upset   Venlafaxine     REACTION: agitation   Current Outpatient Medications on File Prior to Visit  Medication Sig Dispense Refill   amLODipine (NORVASC) 5 MG tablet Take 1 tablet (5 mg total) by mouth daily. 90 tablet 3   hyoscyamine (LEVSIN, ANASPAZ) 0.125 MG tablet Take 0.125 mg by mouth as needed.     linaclotide (LINZESS) 72 MCG capsule Take 1 capsule (72 mcg total) by mouth daily before breakfast. 30 capsule 1   sildenafil (VIAGRA) 100 MG tablet Take 0.5-1 tablets (50-100 mg total) by mouth daily as needed for erectile dysfunction. 5 tablet 11  No current facility-administered medications on file prior to visit.        ROS:  All others reviewed and negative.  Objective        PE:  BP 116/62 (BP Location: Left Arm, Patient Position: Sitting, Cuff Size: Normal)   Pulse 99   Temp 98.3 F (36.8 C) (Oral)   Ht '5\' 6"'$  (1.676 m)   Wt 162 lb (73.5 kg)   SpO2 97%   BMI 26.15 kg/m                 Constitutional: Pt appears in NAD               HENT: Head: NCAT.                Right Ear: External ear normal.                 Left Ear: External ear normal.                Eyes: . Pupils are equal, round, and reactive to  light. Conjunctivae and EOM are normal               Nose: without d/c or deformity               Neck: Neck supple. Gross normal ROM               Cardiovascular: Normal rate and regular rhythm.                 Pulmonary/Chest: Effort normal and breath sounds without rales or wheezing.                Abd:  Soft, NT, ND, + BS, no organomegaly               Neurological: Pt is alert. At baseline orientation, motor grossly intact               Skin: Skin is warm. No rashes, no other new lesions, LE edema - none               Psychiatric: Pt behavior is normal without agitation , mild depresse affect  Micro: none  Cardiac tracings I have personally interpreted today:  none  Pertinent Radiological findings (summarize): none   Lab Results  Component Value Date   WBC 5.3 12/11/2021   HGB 14.4 12/11/2021   HCT 42.7 12/11/2021   PLT 228.0 12/11/2021   GLUCOSE 96 12/11/2021   CHOL 183 12/11/2021   TRIG 127.0 12/11/2021   HDL 49.40 12/11/2021   LDLDIRECT 130.5 07/25/2010   LDLCALC 108 (H) 12/11/2021   ALT 29 12/11/2021   AST 27 12/11/2021   NA 137 12/11/2021   K 4.5 12/11/2021   CL 102 12/11/2021   CREATININE 1.02 12/11/2021   BUN 18 12/11/2021   CO2 29 12/11/2021   TSH 2.68 12/11/2021   PSA 1.48 12/11/2021   HGBA1C 6.2 12/11/2021   Assessment/Plan:  Micheal Grant is a 55 y.o. Other or two or more races [6] male with  has a past medical history of ABDOMINAL PAIN RIGHT LOWER QUADRANT (08/27/2009), ADD (07/08/2007), ANXIETY (07/08/2007), Aortic atherosclerosis ([place]) (06/15/2020), DEPRESSION (07/08/2007), DIVERTICULOSIS, COLON (07/08/2007), ERECTILE DYSFUNCTION (07/08/2007), ERECTILE DYSFUNCTION, ORGANIC (07/18/2009), FATIGUE (03/09/2008), GLUCOSE INTOLERANCE (07/08/2007), HYPERLIPIDEMIA (07/08/2007), IBS (07/08/2007), INSOMNIA-SLEEP DISORDER-UNSPEC (07/08/2007), Rash and other nonspecific skin eruption (05/23/2008), and Throat pain (01/23/2010).  Vitamin D deficiency Last vitamin D Lab Results   Component Value Date  VD25OH 20.91 (L) 01/09/2021   Low, to start oral replacement   Encounter for well adult exam with abnormal findings Age and sex appropriate education and counseling updated with regular exercise and diet Referrals for preventative services - for colonoscopy Immunizations addressed - none needed Smoking counseling  - none needed Evidence for depression or other mood disorder - chronic mild depression - declines change in tx Most recent labs reviewed. I have personally reviewed and have noted: 1) the patient's medical and social history 2) The patient's current medications and supplements 3) The patient's height, weight, and BMI have been recorded in the chart   Impaired glucose tolerance Lab Results  Component Value Date   HGBA1C 6.2 12/11/2021   Stable, pt to continue current medical treatment  - diet, exercise, wt control   HLD (hyperlipidemia) Lab Results  Component Value Date   LDLCALC 108 (H) 12/11/2021   Uncontrolled, declines statin, pt to continue current low chol diet   Depression Chronic mild stable persistent, decliens change in tx or referral counseling  HTN (hypertension) BP Readings from Last 3 Encounters:  12/11/21 116/62  01/17/21 118/68  06/15/20 (!) 142/80   Stable, pt to continue medical treatment norvasc 5 mg and losartan 50 mg qd   Followup: Return in about 1 year (around 12/12/2022).  Cathlean Cower, MD 12/11/2021 9:53 PM [facility]

## 2021-12-11 NOTE — Assessment & Plan Note (Signed)
Lab Results  Component Value Date   LDLCALC 108 (H) 12/11/2021   Uncontrolled, declines statin, pt to continue current low chol diet

## 2021-12-11 NOTE — Assessment & Plan Note (Signed)
BP Readings from Last 3 Encounters:  12/11/21 116/62  01/17/21 118/68  06/15/20 (!) 142/80   Stable, pt to continue medical treatment norvasc 5 mg and losartan 50 mg qd

## 2021-12-11 NOTE — Assessment & Plan Note (Signed)
Chronic mild stable persistent, decliens change in tx or referral counseling

## 2021-12-11 NOTE — Assessment & Plan Note (Signed)
Last vitamin D Lab Results  Component Value Date   VD25OH 20.91 (L) 01/09/2021   Low, to start oral replacement

## 2021-12-11 NOTE — Assessment & Plan Note (Signed)
Age and sex appropriate education and counseling updated with regular exercise and diet Referrals for preventative services - for colonoscopy Immunizations addressed - none needed Smoking counseling  - none needed Evidence for depression or other mood disorder - chronic mild depression - declines change in tx Most recent labs reviewed. I have personally reviewed and have noted: 1) the patient's medical and social history 2) The patient's current medications and supplements 3) The patient's height, weight, and BMI have been recorded in the chart

## 2021-12-11 NOTE — Assessment & Plan Note (Signed)
Lab Results  Component Value Date   HGBA1C 6.2 12/11/2021   Stable, pt to continue current medical treatment  - diet, exercise, wt control

## 2021-12-18 ENCOUNTER — Encounter: Payer: Self-pay | Admitting: Gastroenterology

## 2022-02-03 ENCOUNTER — Encounter: Payer: Self-pay | Admitting: Gastroenterology

## 2022-02-03 ENCOUNTER — Ambulatory Visit: Payer: 59 | Admitting: Gastroenterology

## 2022-02-03 VITALS — BP 130/86 | HR 65 | Ht 66.0 in | Wt 163.2 lb

## 2022-02-03 DIAGNOSIS — K5909 Other constipation: Secondary | ICD-10-CM | POA: Diagnosis not present

## 2022-02-03 DIAGNOSIS — R112 Nausea with vomiting, unspecified: Secondary | ICD-10-CM

## 2022-02-03 DIAGNOSIS — K625 Hemorrhage of anus and rectum: Secondary | ICD-10-CM | POA: Diagnosis not present

## 2022-02-03 DIAGNOSIS — R1031 Right lower quadrant pain: Secondary | ICD-10-CM

## 2022-02-03 MED ORDER — NA SULFATE-K SULFATE-MG SULF 17.5-3.13-1.6 GM/177ML PO SOLN: 1.0000 | Freq: Once | ORAL | 0 refills | Status: DC

## 2022-02-03 NOTE — Patient Instructions (Addendum)
_______________________________________________________  If you are age 58 or older, your body mass index should be between 23-30. Your Body mass index is 26.35 kg/m. If this is out of the aforementioned range listed, please consider follow up with your Primary Care Provider.  If you are age 30 or younger, your body mass index should be between 19-25. Your Body mass index is 26.35 kg/m. If this is out of the aformentioned range listed, please consider follow up with your Primary Care Provider.   ________________________________________________________  The [facility] providers would like to encourage you to use [facility] to communicate with providers for non-urgent requests or questions.  Due to long hold times on the telephone, sending your provider a message by [facility] may be a faster and more efficient way to get a response.  Please allow 48 business hours for a response.  Please remember that this is for non-urgent requests.  _______________________________________________________  Micheal Grant have been scheduled for a colonoscopy. Please follow written instructions given to you at your visit today.  Please pick up your prep supplies at the pharmacy within the next 1-3 days. If you use inhalers (even only as needed), please bring them with you on the day of your procedure.  Due to recent changes in healthcare laws, you may see the results of your imaging and laboratory studies on MyChart before your provider has had a chance to review them.  We understand that in some cases there may be results that are confusing or concerning to you. Not all laboratory results come back in the same time frame and the provider may be waiting for multiple results in order to interpret others.  Please give [place] 48 hours in order for your provider to thoroughly review all the results before contacting the office for clarification of your results.   Medication Samples have been provided to the patient.  Drug name: Trulance        Strength: '3mg'$         Qty: 6  LOT: [id number]  Exp.Date: 05-2023, take one a day  It was a pleasure to see you today!  Thank you for trusting me with your gastrointestinal care!

## 2022-02-03 NOTE — Progress Notes (Signed)
[facility] Consult Note:  History: Micheal Grant 02/03/2022  Referring provider: Biagio Borg, MD  Reason for consult/chief complaint: Colonoscopy (Discuss having), Rectal Bleeding (BRB), Rectal Pain (Burning sensation), Constipation (Last BM this morning, Stool broken up the color is normal), Abdominal Pain (RLQ), and nausea and emesis   Subjective  HPI: Micheal Grant was last seen July 2019 for chronic right lower quadrant abdominal pain constipation and bloating.  Pain and constipation improved on as needed use of MiraLAX, bloating persisted. Colonoscopy to work-up the symptoms May 2018 notable for pandiverticulosis (left greater than right), and a diminutive cecal tubular adenoma. ____________________________  Micheal Grant who is here to discuss lower GI issues and consider having him repeat colonoscopy. He describes rectal bleeding with a perianal or rectal burning sensation as well as constipation.  He continues to have right lower quadrant pain and also has had a couple episodes of nausea and vomiting, 1 of which occurred during a flare of the right-sided abdominal pain and constipation.  There is been some intermittent hemorrhoidal bleeding as well.   ROS:  Review of Systems  Constitutional:  Negative for appetite change and unexpected weight change.  HENT:  Negative for mouth sores and voice change.   Eyes:  Negative for pain and redness.  Respiratory:  Negative for cough and shortness of breath.   Cardiovascular:  Negative for chest pain and palpitations.  Genitourinary:  Negative for dysuria and hematuria.  Musculoskeletal:  Negative for arthralgias and myalgias.  Skin:  Negative for pallor and rash.  Neurological:  Negative for weakness and headaches.  Hematological:  Negative for adenopathy.    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   FATIGUE 03/09/2008   GLUCOSE INTOLERANCE 07/08/2007   HYPERLIPIDEMIA 07/08/2007   IBS 07/08/2007   INSOMNIA-SLEEP DISORDER-UNSPEC 07/08/2007   Rash and other nonspecific skin eruption 05/23/2008   Throat pain 01/23/2010     Past Surgical History: Past Surgical History:  Procedure Laterality Date   COLONOSCOPY     none       Family History: Family History  Problem Relation Age of Onset   Diabetes Mother    Prostate cancer Father    Heart disease Father        Bypass   Depression Sister    Colon cancer Neg Hx    Colon polyps Neg Hx    Kidney disease Neg Hx    Liver disease Neg Hx    Esophageal cancer Neg Hx    Rectal cancer Neg Hx    Stomach cancer Neg Hx     Social History: Social History   Socioeconomic History   Marital status: Single    Spouse name: Not on file   Number of children: Not on file   Years of education: Not on file   Highest education level: Not on file  Occupational History   Occupation: Primary school teacher: UNEMPLOYED  Tobacco Use   Smoking status: Never   Smokeless tobacco: Never  Vaping Use   Vaping Use: Never used  Substance and Sexual Activity   Alcohol use: Yes    Comment: 1-2 drinks per week   Drug use: No   Sexual activity: Not on file  Other Topics  Concern   Not on file  Social History Narrative   Not on file   Social Determinants of Health   Financial Resource Strain: Not on file  Food Insecurity: Not on file  Transportation Needs: Not on file  Physical Activity: Not on file  Stress: Not on file  Social Connections: Not on file    Allergies: Allergies  Allergen Reactions   Bupropion Hcl     REACTION: agitation   Citalopram Hydrobromide     REACTION: agitation   Desvenlafaxine     REACTION: agitation   Fluoxetine Hcl     REACTION:  agitation   Lipitor [Atorvastatin] Diarrhea    GI upset   Venlafaxine     REACTION: agitation    Outpatient Meds: Current Outpatient Medications  Medication Sig Dispense Refill   linaclotide (LINZESS) 72 MCG capsule Take 1 capsule (72 mcg total) by mouth daily before breakfast. 30 capsule 1   LORazepam (ATIVAN) 0.5 MG tablet Take 1 tablet (0.5 mg total) by mouth daily as needed for anxiety. 90 tablet 1   losartan (COZAAR) 50 MG tablet Take 1 tablet (50 mg total) by mouth daily. 90 tablet 3   zolpidem (AMBIEN) 10 MG tablet TAKE ONE TABLET BY MOUTH AT BEDTIME AS NEEDED 90 tablet 1   amLODipine (NORVASC) 5 MG tablet Take 1 tablet (5 mg total) by mouth daily. 90 tablet 3   hyoscyamine (LEVSIN, ANASPAZ) 0.125 MG tablet Take 0.125 mg by mouth as needed. (Patient not taking: Reported on 02/03/2022)     sildenafil (VIAGRA) 100 MG tablet Take 0.5-1 tablets (50-100 mg total) by mouth daily as needed for erectile dysfunction. (Patient not taking: Reported on 02/03/2022) 5 tablet 11   No current facility-administered medications for this visit.    He tried the Linzess and has still occasionally taken it when needed for severe constipation, but found it difficult to use because it causes loose stool.  ___________________________________________________________________ Objective   Exam:  BP 130/86   Pulse 65   Ht '5\' 6"'$  (1.676 m)   Wt 163 lb 4 oz (74 kg)   BMI 26.35 kg/m  Wt Readings from Last 3 Encounters:  02/03/22 163 lb 4 oz (74 kg)  12/11/21 162 lb (73.5 kg)  01/17/21 163 lb (73.9 kg)    General: Well-appearing Eyes: sclera anicteric, no redness ENT: oral mucosa moist without lesions, no cervical or supraclavicular lymphadenopathy CV: Regular without murmur, no JVD, no peripheral edema Resp: clear to auscultation bilaterally, normal RR and effort noted GI: soft, no tenderness, with active bowel sounds. No guarding or palpable organomegaly noted. Skin; warm and dry, no rash or  jaundice noted Neuro: awake, alert and oriented x 3. Normal gross motor function and fluent speech  Labs:     Latest Ref Rng & Units 12/11/2021    2:51 PM 07/11/2019    4:40 PM 02/04/2017   11:36 AM  CBC  WBC 4.0 - 10.5 K/uL 5.3  5.5  4.8   Hemoglobin 13.0 - 17.0 g/dL 14.4  15.6  15.5   Hematocrit 39.0 - 52.0 % 42.7  46.1  46.3   Platelets 150.0 - 400.0 K/uL 228.0  251.0  240.0       Latest Ref Rng & Units 12/11/2021    2:51 PM 01/09/2021   12:07 PM 06/12/2020    9:37 AM  CMP  Glucose 70 - 99 mg/dL 96  85  129   BUN 6 - 23 mg/dL 18  12  15  Creatinine 0.40 - 1.50 mg/dL 1.02  1.07  1.08   Sodium 135 - 145 mEq/L 137  139  137   Potassium 3.5 - 5.1 mEq/L 4.5  3.8  4.1   Chloride 96 - 112 mEq/L 102  104  104   CO2 19 - 32 mEq/L '29  27  28   '$ Calcium 8.4 - 10.5 mg/dL 9.6  9.4  9.4   Total Protein 6.0 - 8.3 g/dL 6.8  6.8  6.9   Total Bilirubin 0.2 - 1.2 mg/dL 0.5  0.5  0.5   Alkaline Phos 39 - 117 U/L 40  40  35   AST 0 - 37 U/L '27  27  26   '$ ALT 0 - 53 U/L 29  34  33      Assessment: Encounter Diagnoses  Name Primary?   Chronic constipation Yes   RLQ abdominal pain    Rectal bleeding    Nausea and vomiting in adult     Ongoing constipation that I still believe causes this episodic right lower quadrant pain with bloating.  It is also precipitating hemorrhoidal bleeding.  1 episode of nausea and vomiting occurred after some dietary indiscretions, but the other seems to have been related to constipation.  He denies dysphagia, odynophagia or early satiety or weight loss to suggest a concomitant upper digestive condition.  Plan: Trulance samples 3 mg once daily.  If does not work or intolerant of side effects, perhaps a trial of Motegrity may follow.  Colonoscopy to evaluate his symptoms.  The benefits and risks of the planned procedure were described in detail with the patient or (when appropriate) their health care proxy.  Risks were outlined as including, but not limited to,  bleeding, infection, perforation, adverse medication reaction leading to cardiac or pulmonary decompensation, pancreatitis (if ERCP).  The limitation of incomplete mucosal visualization was also discussed.  No guarantees or warranties were given.  Thank you for the courtesy of this consult.  Please call me with any questions or concerns.  [name]  CC: Referring provider noted above

## 2022-02-06 ENCOUNTER — Other Ambulatory Visit: Payer: Self-pay

## 2022-02-06 DIAGNOSIS — K625 Hemorrhage of anus and rectum: Secondary | ICD-10-CM

## 2022-02-06 DIAGNOSIS — K5909 Other constipation: Secondary | ICD-10-CM

## 2022-02-06 DIAGNOSIS — R112 Nausea with vomiting, unspecified: Secondary | ICD-10-CM

## 2022-02-06 DIAGNOSIS — R1031 Right lower quadrant pain: Secondary | ICD-10-CM

## 2022-02-06 MED ORDER — NA SULFATE-K SULFATE-MG SULF 17.5-3.13-1.6 GM/177ML PO SOLN: 1.0000 | Freq: Once | ORAL | 0 refills | Status: AC

## 2022-02-08 IMAGING — CT CT CARDIAC CORONARY ARTERY CALCIUM SCORE
3 series · 14 of 20 positions shown, 15 images · non-contrast
Comparison: None.
COMPARISON: None.

Addendum:
EXAM:
OVER-READ INTERPRETATION  CT CHEST

The following report is an over-read performed by radiologist Dr.
over-read does not include interpretation of cardiac or coronary
anatomy or pathology. The coronary calcium score interpretation by
the cardiologist is attached.
CLINICAL DATA: Cardiovascular Disease Risk stratification
Coronary Calcium Score
TECHNIQUE: A gated, non-contrast computed tomography scan of the heart was
performed using 3mm slice thickness. Axial images were analyzed on a
dedicated workstation. Calcium scoring of the coronary arteries was
performed using the Agatston method.

[Series 2: casc 3.0 bv41 2 bestdiast 69 % · axial · 0.40mm/px · z∈[-224,-150]mm · 4 of 43 slices shown, 5 images]
[im 9/43  vessel]
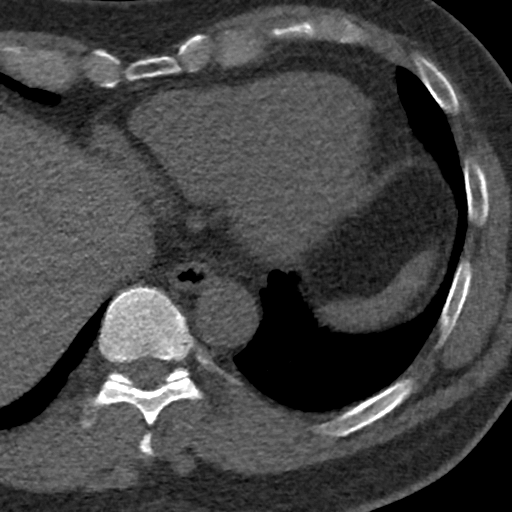
[im 9/43  lung]
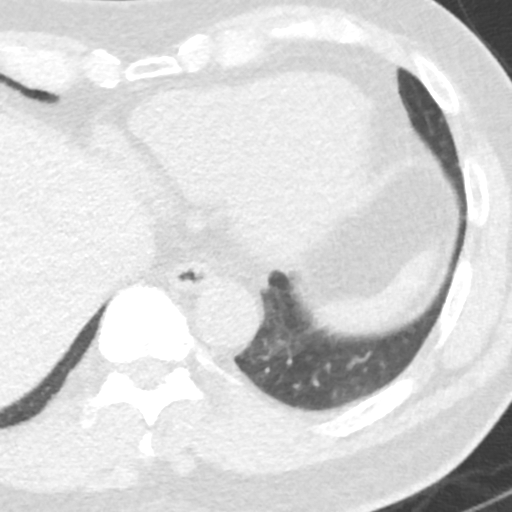
[im 17/43  vessel]
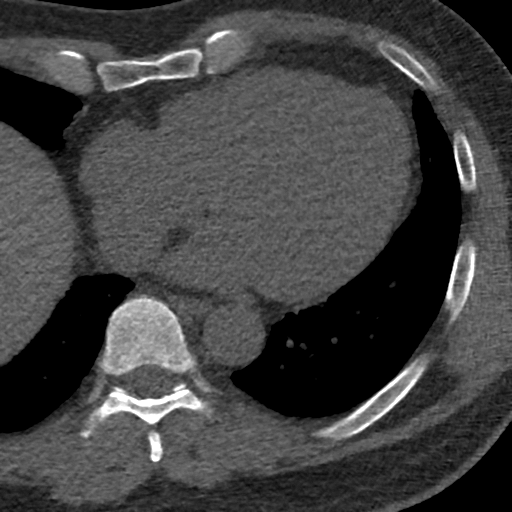
[im 26/43  vessel]
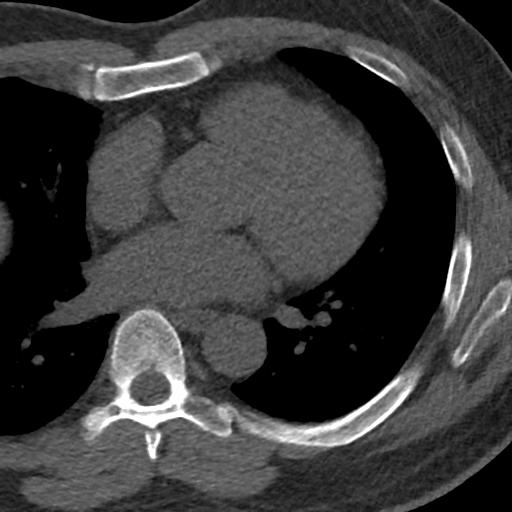
[im 34/43  vessel]
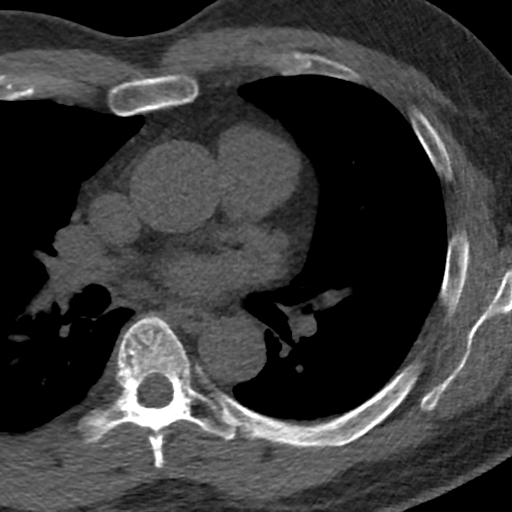

[Series 3: lung 69 % · axial · 0.68mm/px · z∈[-226,-124]mm · 5 of 52 slices shown]
[im 9/52  lung]
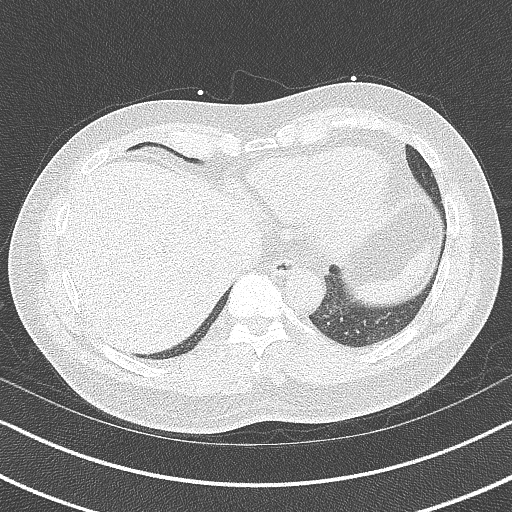
[im 18/52  lung]
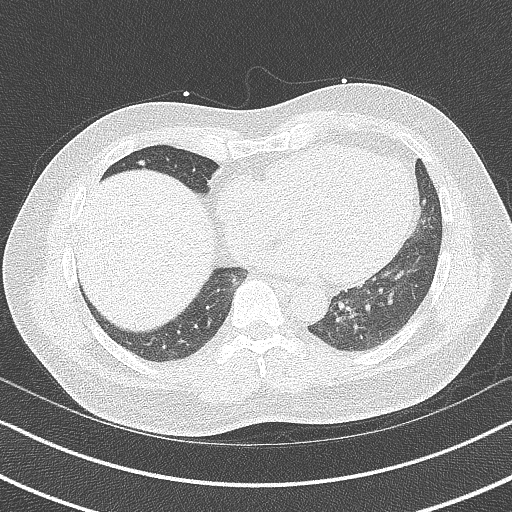
[im 26/52  lung]
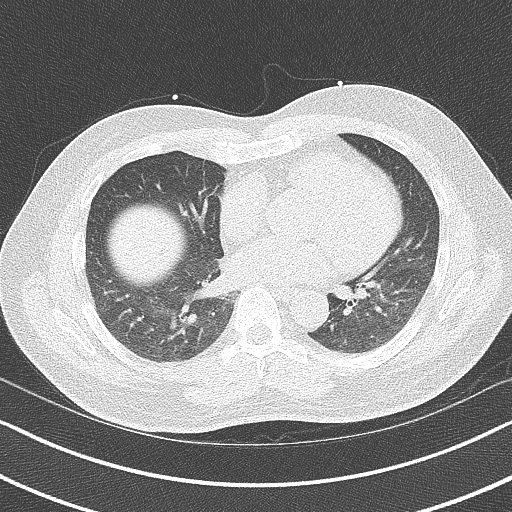
[im 35/52  lung]
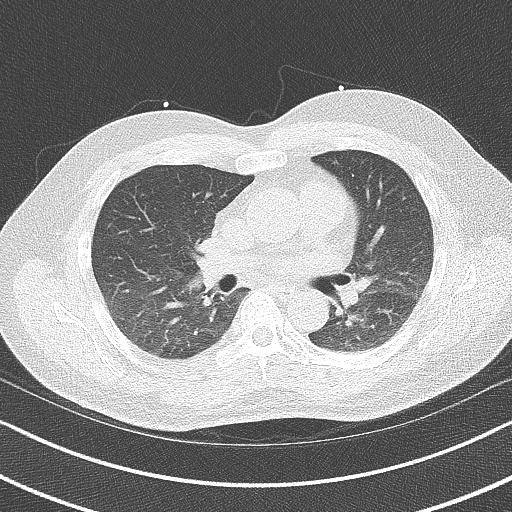
[im 43/52  lung]
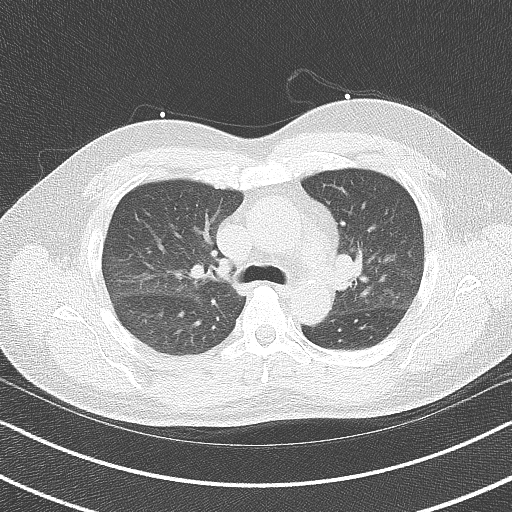

[Series 4: lung st 69 % · axial · 0.68mm/px · z∈[-226,-124]mm · 5 of 52 slices shown]
[im 9/52  lung]
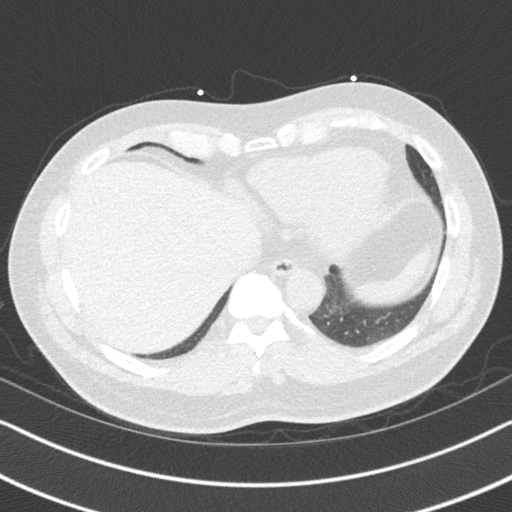
[im 18/52  lung]
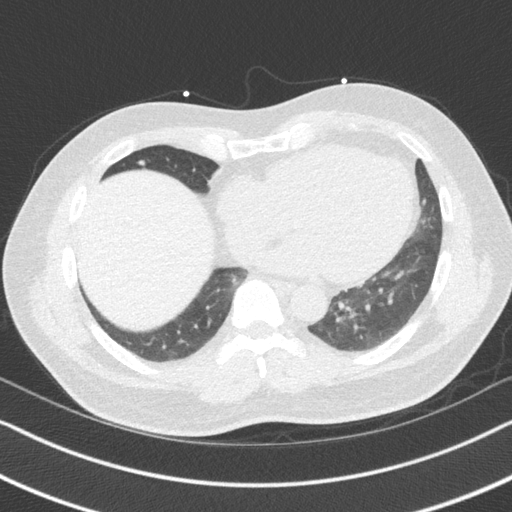
[im 26/52  lung]
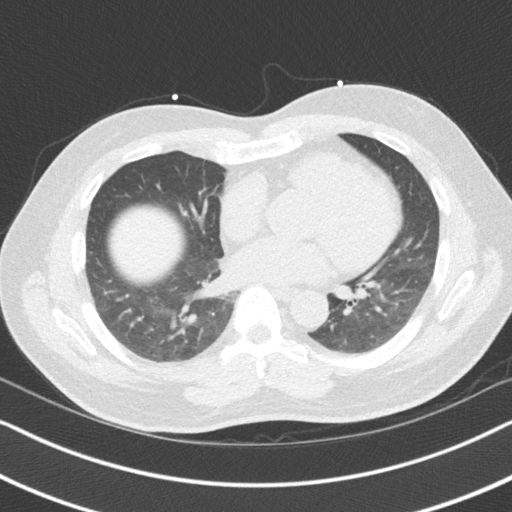
[im 35/52  lung]
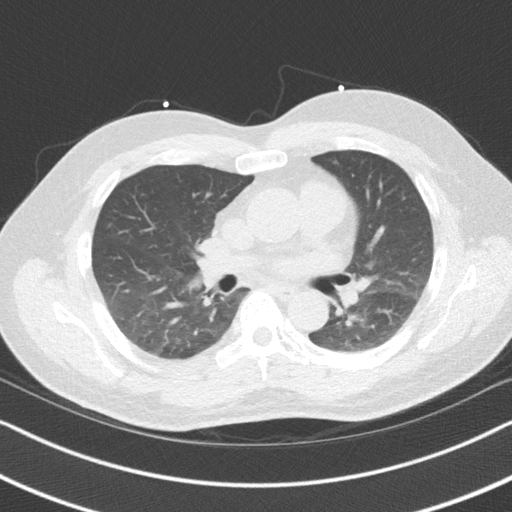
[im 43/52  lung]
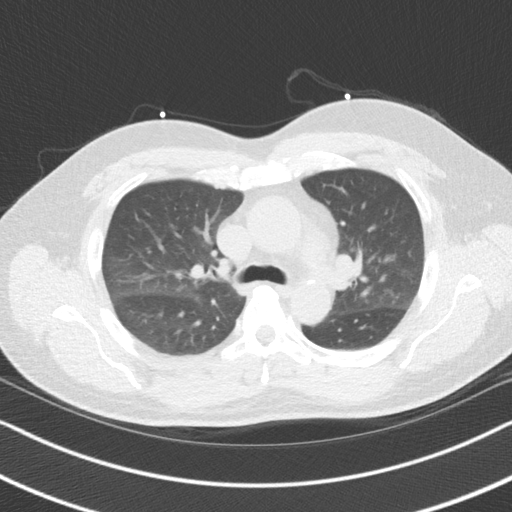

[14 of 20 positions shown; findings below may reference images not displayed]

FINDINGS: Vascular: No significant noncardiac vascular findings.

Mediastinum/Nodes: Visualized mediastinum and hilar regions
demonstrate no lymphadenopathy or masses.

Lungs/Pleura: Visualized lungs show no evidence of pulmonary edema,
consolidation, pneumothorax, nodule or pleural fluid.

Upper Abdomen: No acute abnormality.

Musculoskeletal: No chest wall mass or suspicious bone lesions
identified.
IMPRESSION: No significant incidental findings.
FINDINGS: Coronary arteries: Normal origins.

Coronary Calcium Score:

Left main: 0

Left anterior descending artery:

Left circumflex artery: 0

Right coronary artery: 0

Total: 6.7 Agatston units

Percentile: 60th

Pericardium: Normal.

Ascending Aorta: Normal caliber.

Non-cardiac: See separate report from [REDACTED].
IMPRESSION: Coronary calcium score of 6.7 Agatston units. This was 60th
percentile for age-, race-, and sex-matched controls.



If CAC=0, it is reasonable to withhold statin therapy and reassess
in 5 to 10 years, as long as higher risk conditions are absent
(diabetes mellitus, family history of premature CHD in first degree
relatives (males <55 years; females <65 years), cigarette smoking,
or LDL >=190 mg/dL).

If CAC is 1 to 99, it is reasonable to initiate statin therapy for
patients >=55 years of age.

If CAC is >=100 or >=75th percentile, it is reasonable to initiate
statin therapy at any age.

Cardiology referral should be considered for patients with CAC
scores >=400 or >=75th percentile.

*5443 AHA/ACC/AACVPR/AAPA/ABC/RUDI/LILIT/MAKY/Aglacia/LASANAS/BRAHIMAJ/MAYABEL
Guideline on the Management of Blood Cholesterol: A Report of the
American College of Cardiology/American Heart Association Task Force
on Clinical Practice Guidelines. J Am Coll Cardiol.
2122;73(24):4177-4409.

*** End of Addendum ***
EXAM:
OVER-READ INTERPRETATION  CT CHEST

The following report is an over-read performed by radiologist Dr.
over-read does not include interpretation of cardiac or coronary
anatomy or pathology. The coronary calcium score interpretation by
the cardiologist is attached.
FINDINGS: Vascular: No significant noncardiac vascular findings.

Mediastinum/Nodes: Visualized mediastinum and hilar regions
demonstrate no lymphadenopathy or masses.

Lungs/Pleura: Visualized lungs show no evidence of pulmonary edema,
consolidation, pneumothorax, nodule or pleural fluid.

Upper Abdomen: No acute abnormality.

Musculoskeletal: No chest wall mass or suspicious bone lesions
identified.
IMPRESSION: No significant incidental findings.

## 2022-02-12 ENCOUNTER — Encounter: Payer: Self-pay | Admitting: Gastroenterology

## 2022-02-19 ENCOUNTER — Encounter: Payer: Self-pay | Admitting: Gastroenterology

## 2022-02-19 ENCOUNTER — Ambulatory Visit (AMBULATORY_SURGERY_CENTER): Payer: 59 | Admitting: Gastroenterology

## 2022-02-19 VITALS — BP 124/76 | HR 44 | Temp 97.1°F | Resp 12 | Ht 66.0 in | Wt 163.0 lb

## 2022-02-19 DIAGNOSIS — K625 Hemorrhage of anus and rectum: Secondary | ICD-10-CM | POA: Diagnosis not present

## 2022-02-19 DIAGNOSIS — K5730 Diverticulosis of large intestine without perforation or abscess without bleeding: Secondary | ICD-10-CM | POA: Diagnosis not present

## 2022-02-19 DIAGNOSIS — R1031 Right lower quadrant pain: Secondary | ICD-10-CM | POA: Diagnosis not present

## 2022-02-19 DIAGNOSIS — K5909 Other constipation: Secondary | ICD-10-CM

## 2022-02-19 DIAGNOSIS — K649 Unspecified hemorrhoids: Secondary | ICD-10-CM

## 2022-02-19 MED ORDER — SODIUM CHLORIDE 0.9 % IV SOLN: 500.0000 mL | Freq: Once | INTRAVENOUS | Status: DC

## 2022-02-19 NOTE — Progress Notes (Signed)
Report to pacu rn. Vss. Care resumed by rn. 

## 2022-02-19 NOTE — Patient Instructions (Signed)
Discharge instructions given. handouts on Diverticulosis and Hemorrhoids. Resume previous medications. YOU HAD AN ENDOSCOPIC PROCEDURE TODAY AT [facility]:   Refer to the procedure report that was given to you for any specific questions about what was found during the examination.  If the procedure report does not answer your questions, please call your gastroenterologist to clarify.  If you requested that your care partner not be given the details of your procedure findings, then the procedure report has been included in a sealed envelope for you to review at your convenience later.  YOU SHOULD EXPECT: Some feelings of bloating in the abdomen. Passage of more gas than usual.  Walking can help get rid of the air that was put into your GI tract during the procedure and reduce the bloating. If you had a lower endoscopy (such as a colonoscopy or flexible sigmoidoscopy) you may notice spotting of blood in your stool or on the toilet paper. If you underwent a bowel prep for your procedure, you may not have a normal bowel movement for a few days.  Please Note:  You might notice some irritation and congestion in your nose or some drainage.  This is from the oxygen used during your procedure.  There is no need for concern and it should clear up in a day or so.  SYMPTOMS TO REPORT IMMEDIATELY:  Following lower endoscopy (colonoscopy or flexible sigmoidoscopy):  Excessive amounts of blood in the stool  Significant tenderness or worsening of abdominal pains  Swelling of the abdomen that is new, acute  Fever of 100F or higher   For urgent or emergent issues, a gastroenterologist can be reached at any hour by calling [phone]. Do not use MyChart messaging for urgent concerns.    DIET:  We do recommend a small meal at first, but then you may proceed to your regular diet.  Drink plenty of fluids but you should avoid alcoholic beverages for 24 hours.  ACTIVITY:  You should plan to  take it easy for the rest of today and you should NOT DRIVE or use heavy machinery until tomorrow (because of the sedation medicines used during the test).    FOLLOW UP: Our staff will call the number listed on your records the next business day following your procedure.  We will call around 7:15- 8:00 am to check on you and address any questions or concerns that you may have regarding the information given to you following your procedure. If we do not reach you, we will leave a message.     If any biopsies were taken you will be contacted by phone or by letter within the next 1-3 weeks.  Please call us at [phone] if you have not heard about the biopsies in 3 weeks.    SIGNATURES/CONFIDENTIALITY: You and/or your care partner have signed paperwork which will be entered into your electronic medical record.  These signatures attest to the fact that that the information above on your After Visit Summary has been reviewed and is understood.  Full responsibility of the confidentiality of this discharge information lies with you and/or your care-partner.

## 2022-02-19 NOTE — Op Note (Signed)
[facility] Patient Name: Micheal Grant Procedure Date: 02/19/2022 3:12 PM MRN: [id number] Endoscopist: Mallie Mussel L. Loletha Carrow , MD, [id number] Age: 56 Referring MD:  Date of Birth: May 16, 1965 Gender: Male Account #: [id number] Procedure:                Colonoscopy Indications:              Abdominal pain in the right lower quadrant, Rectal                            bleeding, Constipation                           also had diminutive cecal tubular adenoma May 2018 Medicines:                Monitored Anesthesia Care Procedure:                Pre-Anesthesia Assessment:                           - Prior to the procedure, a History and Physical                            was performed, and patient medications and                            allergies were reviewed. The patient's tolerance of                            previous anesthesia was also reviewed. The risks                            and benefits of the procedure and the sedation                            options and risks were discussed with the patient.                            All questions were answered, and informed consent                            was obtained. Prior Anticoagulants: The patient has                            taken no anticoagulant or antiplatelet agents. ASA                            Grade Assessment: II - A patient with mild systemic                            disease. After reviewing the risks and benefits,                            the patient was deemed in satisfactory condition to  undergo the procedure.                           After obtaining informed consent, the colonoscope                            was passed under direct vision. Throughout the                            procedure, the patient's blood pressure, pulse, and                            oxygen saturations were monitored continuously. The                            Olympus CF-HQ190L [id number])  Colonoscope was                            introduced through the anus and advanced to the the                            terminal ileum, with identification of the                            appendiceal orifice and IC valve. The colonoscopy                            was performed without difficulty. The patient                            tolerated the procedure well. The quality of the                            bowel preparation was excellent. The terminal                            ileum, ileocecal valve, appendiceal orifice, and                            rectum were photographed. Scope In: 3:43:54 PM Scope Out: 3:53:36 PM Scope Withdrawal Time: 0 hours 7 minutes 10 seconds  Total Procedure Duration: 0 hours 9 minutes 42 seconds  Findings:                 The perianal and digital rectal examinations were                            normal.                           The terminal ileum contained two diminutive                            diverticula.  Multiple diverticula were found in the left colon                            and right colon.                           Internal hemorrhoids were found. The hemorrhoids                            were small.                           A mucosal scar was found in the distal rectum.                           The exam was otherwise without abnormality on                            direct and retroflexion views. Complications:            No immediate complications. Estimated Blood Loss:     Estimated blood loss: none. Impression:               - Ileal diverticula.                           - Diverticulosis in the left colon and in the right                            colon.                           - Internal hemorrhoids.                           - Scar in the distal rectum.                           - The examination was otherwise normal on direct                            and retroflexion views.                            - No specimens collected. Recommendation:           - Patient has a contact number available for                            emergencies. The signs and symptoms of potential                            delayed complications were discussed with the                            patient. Return to normal activities tomorrow.  Written discharge instructions were provided to the                            patient.                           - Resume previous diet.                           - Continue present medications.                           - Repeat colonoscopy in 10 years for surveillance.                            (see above for polyp history)                           - Try the samples of Trulance given at recent                            office visit and call me with an update on effect.                           Preparation H suppository as needed for                            hemorrhoidal itching or bleeding.                           (Hemorrhoids appear likely too small for banding.) Micheal Grant L. Loletha Carrow, MD 02/19/2022 4:01:04 PM This report has been signed electronically.

## 2022-02-19 NOTE — Progress Notes (Signed)
No changes to clinical history since GI office visit on 02/03/22.  The patient is appropriate for an endoscopic procedure in the ambulatory setting.  - Adan Baehr Danis, MD    

## 2022-02-20 ENCOUNTER — Telehealth: Payer: Self-pay

## 2022-02-20 NOTE — Telephone Encounter (Signed)
Attempted f/u call. No answer left VM.

## 2022-07-23 ENCOUNTER — Other Ambulatory Visit: Payer: Self-pay | Admitting: Internal Medicine

## 2022-07-23 NOTE — Telephone Encounter (Signed)
Controlled rx.. please advise refill

## 2023-01-07 ENCOUNTER — Other Ambulatory Visit: Payer: Self-pay | Admitting: Internal Medicine

## 2023-01-07 ENCOUNTER — Other Ambulatory Visit: Payer: Self-pay

## 2023-02-16 ENCOUNTER — Other Ambulatory Visit: Payer: Self-pay | Admitting: Internal Medicine

## 2023-06-23 ENCOUNTER — Encounter: Payer: Self-pay | Admitting: Internal Medicine

## 2023-06-23 ENCOUNTER — Ambulatory Visit (INDEPENDENT_AMBULATORY_CARE_PROVIDER_SITE_OTHER): Payer: 59 | Admitting: Internal Medicine

## 2023-06-23 VITALS — BP 100/64 | HR 44 | Temp 98.4°F | Ht 66.0 in | Wt 160.0 lb

## 2023-06-23 DIAGNOSIS — R739 Hyperglycemia, unspecified: Secondary | ICD-10-CM

## 2023-06-23 DIAGNOSIS — E559 Vitamin D deficiency, unspecified: Secondary | ICD-10-CM

## 2023-06-23 DIAGNOSIS — E538 Deficiency of other specified B group vitamins: Secondary | ICD-10-CM | POA: Diagnosis not present

## 2023-06-23 DIAGNOSIS — N401 Enlarged prostate with lower urinary tract symptoms: Secondary | ICD-10-CM

## 2023-06-23 DIAGNOSIS — R0600 Dyspnea, unspecified: Secondary | ICD-10-CM | POA: Diagnosis not present

## 2023-06-23 DIAGNOSIS — I10 Essential (primary) hypertension: Secondary | ICD-10-CM | POA: Diagnosis not present

## 2023-06-23 DIAGNOSIS — Z0000 Encounter for general adult medical examination without abnormal findings: Secondary | ICD-10-CM | POA: Diagnosis not present

## 2023-06-23 DIAGNOSIS — Z125 Encounter for screening for malignant neoplasm of prostate: Secondary | ICD-10-CM

## 2023-06-23 DIAGNOSIS — E785 Hyperlipidemia, unspecified: Secondary | ICD-10-CM | POA: Diagnosis not present

## 2023-06-23 DIAGNOSIS — R3912 Poor urinary stream: Secondary | ICD-10-CM

## 2023-06-23 DIAGNOSIS — Z0001 Encounter for general adult medical examination with abnormal findings: Secondary | ICD-10-CM

## 2023-06-23 LAB — VITAMIN D 25 HYDROXY (VIT D DEFICIENCY, FRACTURES): VITD: 79.99 ng/mL (ref 30.00–100.00)

## 2023-06-23 LAB — URINALYSIS, ROUTINE W REFLEX MICROSCOPIC
Bilirubin Urine: NEGATIVE
Hgb urine dipstick: NEGATIVE
Ketones, ur: NEGATIVE
Leukocytes,Ua: NEGATIVE
Nitrite: NEGATIVE
RBC / HPF: NONE SEEN (ref 0–?)
Specific Gravity, Urine: 1.015 (ref 1.000–1.030)
Total Protein, Urine: NEGATIVE
Urine Glucose: NEGATIVE
Urobilinogen, UA: 0.2 (ref 0.0–1.0)
pH: 6 (ref 5.0–8.0)

## 2023-06-23 LAB — HEPATIC FUNCTION PANEL
ALT: 26 U/L (ref 0–53)
AST: 29 U/L (ref 0–37)
Albumin: 4.5 g/dL (ref 3.5–5.2)
Alkaline Phosphatase: 39 U/L (ref 39–117)
Bilirubin, Direct: 0.1 mg/dL (ref 0.0–0.3)
Total Bilirubin: 0.5 mg/dL (ref 0.2–1.2)
Total Protein: 6.9 g/dL (ref 6.0–8.3)

## 2023-06-23 LAB — BASIC METABOLIC PANEL
BUN: 14 mg/dL (ref 6–23)
CO2: 26 meq/L (ref 19–32)
Calcium: 9.6 mg/dL (ref 8.4–10.5)
Chloride: 104 meq/L (ref 96–112)
Creatinine, Ser: 1.02 mg/dL (ref 0.40–1.50)
GFR: 80.27 mL/min (ref >60.00)
Glucose, Bld: 90 mg/dL (ref 70–99)
Potassium: 4.8 meq/L (ref 3.5–5.1)
Sodium: 139 meq/L (ref 135–145)

## 2023-06-23 LAB — CBC WITH DIFFERENTIAL/PLATELET
Basophils Absolute: 0.1 10*3/uL (ref 0.0–0.1)
Basophils Relative: 1.1 % (ref 0.0–3.0)
Eosinophils Absolute: 0.2 10*3/uL (ref 0.0–0.7)
Eosinophils Relative: 3.6 % (ref 0.0–5.0)
HCT: 43 % (ref 39.0–52.0)
Hemoglobin: 14.5 g/dL (ref 13.0–17.0)
Lymphocytes Relative: 31 % (ref 12.0–46.0)
Lymphs Abs: 1.5 10*3/uL (ref 0.7–4.0)
MCHC: 33.8 g/dL (ref 30.0–36.0)
MCV: 94 fl (ref 78.0–100.0)
Monocytes Absolute: 0.5 10*3/uL (ref 0.1–1.0)
Monocytes Relative: 11.1 % (ref 3.0–12.0)
Neutro Abs: 2.6 10*3/uL (ref 1.4–7.7)
Neutrophils Relative %: 53.2 % (ref 43.0–77.0)
Platelets: 246 10*3/uL (ref 150.0–400.0)
RBC: 4.58 Mil/uL (ref 4.22–5.81)
RDW: 12.8 % (ref 11.5–15.5)
WBC: 4.8 10*3/uL (ref 4.0–10.5)

## 2023-06-23 LAB — LIPID PANEL
Cholesterol: 183 mg/dL (ref 0–200)
HDL: 46.2 mg/dL (ref >39.00)
LDL Cholesterol: 115 mg/dL — ABNORMAL HIGH (ref 0–99)
NonHDL: 136.97
Total CHOL/HDL Ratio: 4
Triglycerides: 108 mg/dL (ref 0.0–149.0)
VLDL: 21.6 mg/dL (ref 0.0–40.0)

## 2023-06-23 LAB — PSA: PSA: 2.29 ng/mL (ref 0.10–4.00)

## 2023-06-23 LAB — VITAMIN B12: Vitamin B-12: 756 pg/mL (ref 211–911)

## 2023-06-23 LAB — HEMOGLOBIN A1C: Hgb A1c MFr Bld: 6 % (ref 4.6–6.5)

## 2023-06-23 LAB — TSH: TSH: 2.26 u[IU]/mL (ref 0.35–5.50)

## 2023-06-23 MED ORDER — ZOLPIDEM TARTRATE 10 MG PO TABS: ORAL_TABLET | ORAL | 1 refills | Status: DC

## 2023-06-23 MED ORDER — LOSARTAN POTASSIUM 25 MG PO TABS: 25.0000 mg | ORAL_TABLET | Freq: Every day | ORAL | 3 refills | Status: AC

## 2023-06-23 MED ORDER — SILDENAFIL CITRATE 100 MG PO TABS: 50.0000 mg | ORAL_TABLET | Freq: Every day | ORAL | 11 refills | Status: AC | PRN

## 2023-06-23 MED ORDER — LORAZEPAM 0.5 MG PO TABS: 0.5000 mg | ORAL_TABLET | Freq: Every day | ORAL | 1 refills | Status: DC | PRN

## 2023-06-23 MED ORDER — TAMSULOSIN HCL 0.4 MG PO CAPS: 0.4000 mg | ORAL_CAPSULE | Freq: Every day | ORAL | 3 refills | Status: AC

## 2023-06-23 MED ORDER — ALBUTEROL SULFATE HFA 108 (90 BASE) MCG/ACT IN AERS: 2.0000 | INHALATION_SPRAY | Freq: Four times a day (QID) | RESPIRATORY_TRACT | 5 refills | Status: AC | PRN

## 2023-06-23 NOTE — Progress Notes (Unsigned)
 Patient ID: Micheal Grant, male   DOB: 03-06-1966, 58 y.o.   MRN: [id number]         Chief Complaint:: wellness exam and Annual Exam (Discuss weight loss challenges ) , bph, doe, overweight       HPI:  Micheal Grant is a 58 y.o. male here for wellness exam; declines covid booster, o/w up to date                         Also Pt denies chest pain, orthopnea, PND, increased LE swelling, palpitations, dizziness or syncope but has mild sob and wheezy at times.  .   Pt denies polydipsia, polyuria, or new focal neuro s/s.    Pt denies fever, wt loss, night sweats, loss of appetite, or other constitutional symptoms   Peak wt has been about 180 years ago.   Also has some weak urine stream and nocturia at times.    Wt Readings from Last 3 Encounters:  06/23/23 160 lb (72.6 kg)  02/19/22 163 lb (73.9 kg)  02/03/22 163 lb 4 oz (74 kg)   BP Readings from Last 3 Encounters:  06/23/23 100/64  02/19/22 124/76  02/03/22 130/86   Immunization History  Administered Date(s) Administered   Influenza Inj Mdck Quad Pf 04/05/2022   Influenza, Mdck, Trivalent,PF 6+ MOS(egg free) 03/11/2023   Influenza,inj,Quad PF,6+ Mos 02/28/2016, 02/04/2017, 03/22/2018, 03/09/2020   Influenza-Unspecified 03/31/2019, 04/11/2021   Moderna Sars-Covid-2 Vaccination 11/13/2020   PFIZER(Purple Top)SARS-COV-2 Vaccination 07/02/2019, 07/25/2019, 03/19/2020   Tdap 02/28/2016   Zoster Recombinant(Shingrix) 01/17/2021, 07/10/2021   Health Maintenance Due  Topic Date Due   COVID-19 Vaccine (5 - 2024-25 season) 12/28/2022      Past Medical History:  Diagnosis Date   ABDOMINAL PAIN RIGHT LOWER QUADRANT 08/27/2009   ADD 07/08/2007   ANXIETY 07/08/2007   Aortic atherosclerosis (HCC) 06/15/2020   DEPRESSION 07/08/2007   DIVERTICULOSIS, COLON 07/08/2007   ERECTILE DYSFUNCTION 07/08/2007   ERECTILE DYSFUNCTION, 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 He has never used smokeless tobacco. He reports current alcohol use. He reports that he does not use drugs. family history includes Depression in his sister; Diabetes in his mother; Heart disease in his father; Prostate cancer in his father. Allergies  Allergen Reactions   Bupropion Hcl     REACTION: agitation   Citalopram Hydrobromide     REACTION: agitation   Desvenlafaxine     REACTION: agitation   Fluoxetine Hcl     REACTION: agitation   Lipitor [Atorvastatin] Diarrhea    GI upset   Venlafaxine     REACTION: agitation   Current Outpatient Medications on File Prior to Visit  Medication Sig Dispense Refill   linaclotide (LINZESS) 72 MCG capsule Take 1 capsule (72 mcg total) by mouth daily before breakfast. 30 capsule 1   hyoscyamine (LEVSIN, ANASPAZ) 0.125 MG tablet Take 0.125 mg by mouth as needed. (Patient not taking: Reported on 06/23/2023)     No current facility-administered medications on file prior to visit.        ROS:  All others reviewed and negative.  Objective  PE:  BP 100/64 (BP Location: Right Arm, Patient Position: Sitting, Cuff Size: Normal)   Pulse (!) 44   Temp 98.4 F (36.9 C) (Oral)   Ht 5\' 6"  (1.676 m)   Wt 160 lb (72.6 kg)   SpO2 99%   BMI 25.82 kg/m                 Constitutional: Pt appears in NAD               HENT: Head: NCAT.                Right Ear: External ear normal.                 Left Ear: External ear normal.                Eyes: . Pupils are equal, round, and reactive to light. Conjunctivae and EOM are normal               Nose: without d/c or deformity               Neck: Neck supple. Gross normal ROM               Cardiovascular: Normal rate and regular rhythm.                  Pulmonary/Chest: Effort normal and breath sounds without rales or wheezing.                Abd:  Soft, NT, ND, + BS, no organomegaly               Neurological: Pt is alert. At baseline orientation, motor grossly intact               Skin: Skin is warm. No rashes, no other new lesions, LE edema - none               Psychiatric: Pt behavior is normal without agitation   Micro: none  Cardiac tracings I have personally interpreted today:  none  Pertinent Radiological findings (summarize): none   Lab Results  Component Value Date   WBC 4.8 06/23/2023   HGB 14.5 06/23/2023   HCT 43.0 06/23/2023   PLT 246.0 06/23/2023   GLUCOSE 90 06/23/2023   CHOL 183 06/23/2023   TRIG 108.0 06/23/2023   HDL 46.20 06/23/2023   LDLDIRECT 130.5 07/25/2010   LDLCALC 115 (H) 06/23/2023   ALT 26 06/23/2023   AST 29 06/23/2023   NA 139 06/23/2023   K 4.8 06/23/2023   CL 104 06/23/2023   CREATININE 1.02 06/23/2023   BUN 14 06/23/2023   CO2 26 06/23/2023   TSH 2.26 06/23/2023   PSA 2.29 06/23/2023   HGBA1C 6.0 06/23/2023   Assessment/Plan:  Micheal Grant is a 58 y.o. 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  Encounter for well adult exam with abnormal findings Age and sex appropriate education and counseling updated with regular exercise and diet Referrals for preventative services - none needed Immunizations addressed - declines covid booster Smoking counseling  - none needed Evidence  for depression or other mood disorder -  none significant Most recent labs reviewed. I have personally reviewed and have noted: 1) the patient's medical and social history 2) The patient's current medications and supplements 3) The patient's height, weight, and BMI have been recorded in the chart   HLD (hyperlipidemia) Lab Results  Component Value Date   LDLCALC 115 (H) 06/23/2023   Uncontrolled, for lower chol diet, dcelines statin  HTN (hypertension) BP Readings from Last 3 Encounters:  06/23/23 100/64  02/19/22 124/76  02/03/22 130/86   Stable, pt to continue medical treatment losartan 25 qd   Vitamin D deficiency Last vitamin D Lab Results  Component Value Date   VD25OH 79.99 06/23/2023   Stable, cont oral replacement    BPH (benign prostatic hyperplasia) Mild to mod, for flomax 0.4 every day,   to f/u any worsening symptoms or concerns  Dyspnea ? Asthma like  - for trial albuterol hfa prn  Followup: Return in about 1 year (around 06/22/2024).  Oliver Barre, MD 06/25/2023 8:59 PM [facility]

## 2023-06-23 NOTE — Patient Instructions (Signed)
 Please take all new medication as prescribed  - the flomax for prostate, and albuterol for breathing if needed  Please continue all other medications as before, and refills have been done if requested.  Please have the pharmacy call with any other refills you may need.  Please continue your efforts at being more active, low cholesterol diet, and weight control.  You are otherwise up to date with prevention measures today.  Please keep your appointments with your specialists as you may have planned  Please go to the LAB at the blood drawing area for the tests to be done  You will be contacted by phone if any changes need to be made immediately.  Otherwise, you will receive a letter about your results with an explanation, but please check with MyChart first.  Please make an Appointment to return for your 1 year visit, or sooner if needed

## 2023-06-23 NOTE — Progress Notes (Signed)
 The test results show that your current treatment is OK, as the tests are stable and the LDL cholesterol is still mildly elevated.  Please follow lower cholesterol diet and let me know if you would be ok with starting Crestor 10 mg per day. Otherwise,  Please continue the same plan.  There is no other need for change of treatment or further evaluation based on these results, at this time.  thanks

## 2023-06-25 ENCOUNTER — Encounter: Payer: Self-pay | Admitting: Internal Medicine

## 2023-06-25 DIAGNOSIS — R0600 Dyspnea, unspecified: Secondary | ICD-10-CM | POA: Insufficient documentation

## 2023-06-25 DIAGNOSIS — N400 Enlarged prostate without lower urinary tract symptoms: Secondary | ICD-10-CM | POA: Insufficient documentation

## 2023-06-25 NOTE — Assessment & Plan Note (Signed)
 Lab Results  Component Value Date   LDLCALC 115 (H) 06/23/2023   Uncontrolled, for lower chol diet, dcelines statin

## 2023-06-25 NOTE — Assessment & Plan Note (Signed)
 Last vitamin D Lab Results  Component Value Date   VD25OH 79.99 06/23/2023   Stable, cont oral replacement

## 2023-06-25 NOTE — Assessment & Plan Note (Signed)
?   Asthma like  - for trial albuterol hfa prn

## 2023-06-25 NOTE — Assessment & Plan Note (Signed)

## 2023-06-25 NOTE — Assessment & Plan Note (Signed)
 Mild to mod, for flomax 0.4 every day,   to f/u any worsening symptoms or concerns

## 2023-06-25 NOTE — Assessment & Plan Note (Signed)
 BP Readings from Last 3 Encounters:  06/23/23 100/64  02/19/22 124/76  02/03/22 130/86   Stable, pt to continue medical treatment losartan 25 qd

## 2024-03-21 ENCOUNTER — Other Ambulatory Visit: Payer: Self-pay | Admitting: Internal Medicine

## 2024-03-23 ENCOUNTER — Encounter: Payer: Self-pay | Admitting: Internal Medicine

## 2024-03-28 MED ORDER — LORAZEPAM 0.5 MG PO TABS: 0.5000 mg | ORAL_TABLET | Freq: Every day | ORAL | 0 refills | Status: AC | PRN

## 2024-03-28 MED ORDER — ZOLPIDEM TARTRATE 10 MG PO TABS: ORAL_TABLET | ORAL | 0 refills | Status: AC

## 2024-04-23 DIAGNOSIS — M542 Cervicalgia: Secondary | ICD-10-CM | POA: Insufficient documentation

## 2024-04-23 DIAGNOSIS — M62838 Other muscle spasm: Secondary | ICD-10-CM | POA: Insufficient documentation

## 2024-05-06 ENCOUNTER — Ambulatory Visit: Admitting: Internal Medicine

## 2024-05-06 ENCOUNTER — Ambulatory Visit: Payer: Self-pay | Admitting: Internal Medicine

## 2024-05-06 ENCOUNTER — Encounter: Payer: Self-pay | Admitting: Internal Medicine

## 2024-05-06 VITALS — BP 122/70 | HR 65 | Temp 98.4°F | Ht 66.0 in | Wt 162.0 lb

## 2024-05-06 DIAGNOSIS — E7800 Pure hypercholesterolemia, unspecified: Secondary | ICD-10-CM

## 2024-05-06 DIAGNOSIS — I10 Essential (primary) hypertension: Secondary | ICD-10-CM | POA: Diagnosis not present

## 2024-05-06 DIAGNOSIS — M62838 Other muscle spasm: Secondary | ICD-10-CM

## 2024-05-06 DIAGNOSIS — R6882 Decreased libido: Secondary | ICD-10-CM | POA: Insufficient documentation

## 2024-05-06 DIAGNOSIS — E538 Deficiency of other specified B group vitamins: Secondary | ICD-10-CM | POA: Diagnosis not present

## 2024-05-06 DIAGNOSIS — Z0001 Encounter for general adult medical examination with abnormal findings: Secondary | ICD-10-CM

## 2024-05-06 DIAGNOSIS — R7302 Impaired glucose tolerance (oral): Secondary | ICD-10-CM

## 2024-05-06 DIAGNOSIS — Z125 Encounter for screening for malignant neoplasm of prostate: Secondary | ICD-10-CM

## 2024-05-06 DIAGNOSIS — E559 Vitamin D deficiency, unspecified: Secondary | ICD-10-CM | POA: Diagnosis not present

## 2024-05-06 DIAGNOSIS — Z0000 Encounter for general adult medical examination without abnormal findings: Secondary | ICD-10-CM

## 2024-05-06 LAB — LIPID PANEL
Cholesterol: 232 mg/dL — ABNORMAL HIGH (ref 28–200)
HDL: 60.1 mg/dL
LDL Cholesterol: 149 mg/dL — ABNORMAL HIGH (ref 10–99)
NonHDL: 171.63
Total CHOL/HDL Ratio: 4
Triglycerides: 112 mg/dL (ref 10.0–149.0)
VLDL: 22.4 mg/dL (ref 0.0–40.0)

## 2024-05-06 LAB — CBC WITH DIFFERENTIAL/PLATELET
Basophils Absolute: 0.1 K/uL (ref 0.0–0.1)
Basophils Relative: 1.2 % (ref 0.0–3.0)
Eosinophils Absolute: 0.2 K/uL (ref 0.0–0.7)
Eosinophils Relative: 3.5 % (ref 0.0–5.0)
HCT: 45.5 % (ref 39.0–52.0)
Hemoglobin: 15.5 g/dL (ref 13.0–17.0)
Lymphocytes Relative: 27.4 % (ref 12.0–46.0)
Lymphs Abs: 1.5 K/uL (ref 0.7–4.0)
MCHC: 34 g/dL (ref 30.0–36.0)
MCV: 92.7 fl (ref 78.0–100.0)
Monocytes Absolute: 0.6 K/uL (ref 0.1–1.0)
Monocytes Relative: 10.1 % (ref 3.0–12.0)
Neutro Abs: 3.2 K/uL (ref 1.4–7.7)
Neutrophils Relative %: 57.8 % (ref 43.0–77.0)
Platelets: 268 K/uL (ref 150.0–400.0)
RBC: 4.91 Mil/uL (ref 4.22–5.81)
RDW: 12.6 % (ref 11.5–15.5)
WBC: 5.5 K/uL (ref 4.0–10.5)

## 2024-05-06 LAB — BASIC METABOLIC PANEL WITH GFR
BUN: 12 mg/dL (ref 6–23)
CO2: 26 meq/L (ref 19–32)
Calcium: 10.2 mg/dL (ref 8.4–10.5)
Chloride: 103 meq/L (ref 96–112)
Creatinine, Ser: 1.06 mg/dL (ref 0.40–1.50)
GFR: 76.18 mL/min
Glucose, Bld: 99 mg/dL (ref 70–99)
Potassium: 5.2 meq/L — ABNORMAL HIGH (ref 3.5–5.1)
Sodium: 137 meq/L (ref 135–145)

## 2024-05-06 LAB — URINALYSIS, ROUTINE W REFLEX MICROSCOPIC
Bilirubin Urine: NEGATIVE
Hgb urine dipstick: NEGATIVE
Ketones, ur: NEGATIVE
Leukocytes,Ua: NEGATIVE
Nitrite: NEGATIVE
Specific Gravity, Urine: <=1.005 — AB (ref 1.000–1.030)
Total Protein, Urine: NEGATIVE
Urine Glucose: NEGATIVE
Urobilinogen, UA: 0.2 (ref 0.0–1.0)
pH: 6 (ref 5.0–8.0)

## 2024-05-06 LAB — VITAMIN B12: Vitamin B-12: 1029 pg/mL — ABNORMAL HIGH (ref 211–911)

## 2024-05-06 LAB — HEPATIC FUNCTION PANEL
ALT: 46 U/L (ref 3–53)
AST: 32 U/L (ref 5–37)
Albumin: 4.8 g/dL (ref 3.5–5.2)
Alkaline Phosphatase: 48 U/L (ref 39–117)
Bilirubin, Direct: 0.1 mg/dL (ref 0.1–0.3)
Total Bilirubin: 0.5 mg/dL (ref 0.2–1.2)
Total Protein: 7.2 g/dL (ref 6.0–8.3)

## 2024-05-06 LAB — PSA: PSA: 2.69 ng/mL (ref 0.10–4.00)

## 2024-05-06 LAB — TSH: TSH: 3.18 u[IU]/mL (ref 0.35–5.50)

## 2024-05-06 LAB — TESTOSTERONE: Testosterone: 549.27 ng/dL (ref 300.00–890.00)

## 2024-05-06 LAB — VITAMIN D 25 HYDROXY (VIT D DEFICIENCY, FRACTURES): VITD: 38.11 ng/mL (ref 30.00–100.00)

## 2024-05-06 LAB — HEMOGLOBIN A1C: Hgb A1c MFr Bld: 6 % (ref 4.6–6.5)

## 2024-05-06 MED ORDER — LINACLOTIDE 72 MCG PO CAPS: 72.0000 ug | ORAL_CAPSULE | Freq: Every day | ORAL | 3 refills | Status: AC

## 2024-05-06 NOTE — Patient Instructions (Addendum)
 Please make a Nurse Visit appt for next week for the Prevnar 20  Please continue all other medications as before, and refills have been done for the linzess   Please have the pharmacy call with any other refills you may need.  Please continue your efforts at being more active, low cholesterol diet, and weight control.  You are otherwise up to date with prevention measures today.  Please keep your appointments with your specialists as you may have planned  Please go to the LAB at the blood drawing area for the tests to be done, including the testosterone   You will be contacted by phone if any changes need to be made immediately.  Otherwise, you will receive a letter about your results with an explanation, but please check with MyChart first.  Please make an Appointment to return in 6 months, or sooner if needed

## 2024-05-06 NOTE — Assessment & Plan Note (Signed)
 BP Readings from Last 3 Encounters:  05/06/24 122/70  06/23/23 100/64  02/19/22 124/76   Stable, pt to continue medical treatment losartan  25 mg qd

## 2024-05-06 NOTE — Assessment & Plan Note (Signed)
 Lab Results  Component Value Date   HGBA1C 6.0 06/23/2023   Stable, pt to continue current medical treatment  - diet, wt control

## 2024-05-06 NOTE — Assessment & Plan Note (Signed)
 Age and sex appropriate education and counseling updated with regular exercise and diet Referrals for preventative services - none needed Immunizations addressed - pt plans to return next wk for the Prevnar 20 Smoking counseling  - none needed Evidence for depression or other mood disorder - none significant Most recent labs reviewed. I have personally reviewed and have noted: 1) the patient's medical and social history 2) The patient's current medications and supplements 3) The patient's height, weight, and BMI have been recorded in the chart

## 2024-05-06 NOTE — Assessment & Plan Note (Signed)
 Lab Results  Component Value Date   LDLCALC 115 (H) 06/23/2023   Uncontrolled, pt declines statin, pt for lower chol diet, and f/u lab today

## 2024-05-06 NOTE — Assessment & Plan Note (Signed)
 Recent onset, for testosterone  level

## 2024-05-06 NOTE — Assessment & Plan Note (Signed)
 Improved, ok to continue mobic and muscle relaxer prn

## 2024-05-06 NOTE — Assessment & Plan Note (Signed)
 Last vitamin D Lab Results  Component Value Date   VD25OH 79.99 06/23/2023   Stable, cont oral replacement

## 2024-05-06 NOTE — Progress Notes (Signed)
 Patient ID: Micheal Grant, male   DOB: 1965/05/12, 59 y.o.   MRN: [id number]         Chief Complaint:: wellness exam and Neck Pain (And digestive issues has been going a lot more in the last 6 months )  , low libido, low vit d, hyperglycemia, htn, hld       HPI:  Micheal Grant is a 59 y.o. male here for wellness exam; pt will return next wk for prevnar 20, o/w up to date                        Also hard to lose wt with exercise recently with neck pain and lower back pain, seen at [facility], tx with mobic prn and muscle relaxer prn, and plans to f/u soon. Pt denies chest pain, increased sob or doe, wheezing, orthopnea, PND, increased LE swelling, palpitations, dizziness or syncope.   Pt denies polydipsia, polyuria, or new focal neuro s/s.   Does have lower stamina and libido at least mild to mod, asks for testosterone  level. Does have ongoing diarrhea, constipation mostly managed per GI with linzess  prn.  Also had a left cervical paraspinal muscle spasm recent, seen per ortho, tx with mobic prn and muscle relaxer, overall now improved.   Wt Readings from Last 3 Encounters:  05/06/24 162 lb (73.5 kg)  06/23/23 160 lb (72.6 kg)  02/19/22 163 lb (73.9 kg)   BP Readings from Last 3 Encounters:  05/06/24 122/70  06/23/23 100/64  02/19/22 124/76   Immunization History  Administered Date(s) Administered   Influenza Inj Mdck Quad Pf 04/05/2022   Influenza, Mdck, Trivalent,PF 6+ MOS(egg free) 03/11/2023, 03/29/2024   Influenza,inj,Quad PF,6+ Mos 02/28/2016, 02/04/2017, 03/22/2018, 03/09/2020   Influenza-Unspecified 03/31/2019, 04/11/2021   Moderna Sars-Covid-2 Vaccination 11/13/2020   PFIZER(Purple Top)SARS-COV-2 Vaccination 07/02/2019, 07/25/2019, 03/19/2020   Tdap 02/28/2016   Zoster Recombinant(Shingrix ) 01/17/2021, 07/10/2021   Health Maintenance Due  Topic Date Due   Pneumococcal Vaccine: 50+ Years (1 of 1 - PCV) Never done   COVID-19 Vaccine (5 - 2025-26 season)  12/28/2023      Past Medical History:  Diagnosis Date   ABDOMINAL PAIN RIGHT LOWER QUADRANT 08/27/2009   ADD 07/08/2007   ANXIETY 07/08/2007   Aortic atherosclerosis 06/15/2020   DEPRESSION 07/08/2007   DIVERTICULOSIS, COLON 07/08/2007   ERECTILE DYSFUNCTION 07/08/2007   ERECTILE DYSFUNCTION, 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 He has never used smokeless tobacco. He reports current alcohol use. He reports that he does not use drugs. family history includes Depression in his sister; Diabetes in his mother; Heart disease in his father; Prostate cancer in his father. Allergies[1] Medications Ordered Prior to Encounter[2]      ROS:  All others reviewed and negative.  Objective        PE:  BP 122/70 (BP Location: Right Arm, Patient Position: Sitting, Cuff Size: Normal)   Pulse 65   Temp 98.4 F (36.9 C) (Oral)   Ht 5' 6 (1.676 m)   Wt 162 lb (73.5 kg)   SpO2 98%   BMI 26.15 kg/m  Constitutional: Pt appears in NAD               HENT: Head: NCAT.                Right Ear: External ear normal.                 Left Ear: External ear normal.                Eyes: . Pupils are equal, round, and reactive to light. Conjunctivae and EOM are normal               Nose: without d/c or deformity               Neck: Neck supple. Gross normal ROM               Cardiovascular: Normal rate and regular rhythm.                 Pulmonary/Chest: Effort normal and breath sounds without rales or wheezing.                Abd:  Soft, NT, ND, + BS, no organomegaly               Neurological: Pt is alert. At baseline orientation, motor grossly intact               Skin: Skin is  warm. No rashes, no other new lesions, LE edema - none               Psychiatric: Pt behavior is normal without agitation   Micro: none  Cardiac tracings I have personally interpreted today:  none  Pertinent Radiological findings (summarize): none   Lab Results  Component Value Date   WBC 4.8 06/23/2023   HGB 14.5 06/23/2023   HCT 43.0 06/23/2023   PLT 246.0 06/23/2023   GLUCOSE 90 06/23/2023   CHOL 183 06/23/2023   TRIG 108.0 06/23/2023   HDL 46.20 06/23/2023   LDLDIRECT 130.5 07/25/2010   LDLCALC 115 (H) 06/23/2023   ALT 26 06/23/2023   AST 29 06/23/2023   NA 139 06/23/2023   K 4.8 06/23/2023   CL 104 06/23/2023   CREATININE 1.02 06/23/2023   BUN 14 06/23/2023   CO2 26 06/23/2023   TSH 2.26 06/23/2023   PSA 2.29 06/23/2023   HGBA1C 6.0 06/23/2023   Assessment/Plan:  Micheal Grant is a 59 y.o. 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  Encounter for well adult exam with abnormal findings Age and sex appropriate education and counseling updated with regular exercise and diet Referrals for preventative services - none needed Immunizations addressed - pt plans to return next wk for the Prevnar 20 Smoking counseling  - none needed Evidence for depression or other mood disorder - none significant Most recent labs reviewed. I have personally reviewed and have noted: 1) the patient's medical and social history 2) The patient's current medications and supplements 3)  The patient's height, weight, and BMI have been recorded in the chart   Vitamin D  deficiency Last vitamin D  Lab Results  Component Value Date   VD25OH 79.99 06/23/2023  Stable, cont oral replacement   Spasm of cervical paraspinous muscle Improved, ok to continue mobic and muscle relaxer prn  Impaired glucose tolerance Lab Results  Component Value Date   HGBA1C 6.0 06/23/2023   Stable, pt to continue current medical treatment  - diet, wt control   HTN (hypertension) BP Readings from Last 3 Encounters:  05/06/24 122/70  06/23/23 100/64  02/19/22 124/76   Stable, pt to continue medical treatment losartan  25 mg qd   HLD (hyperlipidemia) Lab Results  Component Value Date   LDLCALC 115 (H) 06/23/2023   Uncontrolled, pt declines statin, pt for lower chol diet, and f/u lab today   Low libido Recent onset, for testosterone  level  Followup: Return in about 6 months (around 11/03/2024).  Lynwood Rush, MD 05/06/2024 12:14 PM [facility] [place] [facility] Internal Medicine     [1]  Allergies Allergen Reactions   Bupropion Hcl Other (See Comments)    bupropion   Citalopram Hydrobromide     REACTION: agitation   Desvenlafaxine     REACTION: agitation   Fluoxetine Hcl     REACTION: agitation   Lipitor [Atorvastatin ] Diarrhea    GI upset   Venlafaxine     REACTION: agitation  [2]  Current Outpatient Medications on File Prior to Visit  Medication Sig Dispense Refill   albuterol  (VENTOLIN  HFA) 108 (90 Base) MCG/ACT inhaler Inhale 2 puffs into the lungs every 6 (six) hours as needed for wheezing or shortness of breath. 8 g 5   cyclobenzaprine  (FLEXERIL ) 5 MG tablet TAKE 1 TABLET EVERY DAY AT DINNER, FOR AS NEEDED FOR MUSCLE SPASM.     hyoscyamine  (LEVSIN , ANASPAZ ) 0.125 MG tablet Take 0.125 mg by mouth as needed. (Patient not taking: Reported on 06/23/2023)     LORazepam  (ATIVAN ) 0.5 MG tablet Take 1 tablet (0.5 mg total) by mouth daily  as needed for anxiety. 90 tablet 0   losartan  (COZAAR ) 25 MG tablet Take 1 tablet (25 mg total) by mouth daily. 90 tablet 3   meloxicam (MOBIC) 15 MG tablet TAKE 1 TABLET EVERY DAY WITH MEAL(S), FOR PAIN AND INFLAMMATION DO NOT TAKE WITH MEDROL  DOSE PACK     sildenafil  (VIAGRA ) 100 MG tablet Take 0.5-1 tablets (50-100 mg total) by mouth daily as needed for erectile dysfunction. 5 tablet 11   tamsulosin  (FLOMAX ) 0.4 MG CAPS capsule Take 1 capsule (0.4 mg total) by mouth daily. 90 capsule 3   zolpidem  (AMBIEN ) 10 MG tablet TAKE 1 TABLET BY MOUTH EVERY DAY AT BEDTIME AS NEEDED 90 tablet 0   No current facility-administered medications on file prior to visit.
